# Patient Record
Sex: Female | Born: 1985 | Race: Black or African American | Hispanic: No | Marital: Single | Smoking: Current every day smoker
Health system: Southern US, Community
[De-identification: ages and names within clinical notes are randomized; demographics above are authoritative.]

## PROBLEM LIST (undated history)

## (undated) HISTORY — PX: CHOLECYSTECTOMY: SHX55

---

## 2007-04-24 ENCOUNTER — Emergency Department: Payer: Self-pay | Admitting: Emergency Medicine

## 2007-11-15 ENCOUNTER — Inpatient Hospital Stay: Payer: Self-pay

## 2010-07-01 ENCOUNTER — Inpatient Hospital Stay: Payer: Self-pay | Admitting: Surgery

## 2010-07-11 ENCOUNTER — Ambulatory Visit: Payer: Self-pay | Admitting: Surgery

## 2011-03-14 ENCOUNTER — Ambulatory Visit: Payer: Self-pay | Admitting: Internal Medicine

## 2012-07-26 ENCOUNTER — Emergency Department: Payer: Self-pay | Admitting: Emergency Medicine

## 2013-05-21 ENCOUNTER — Emergency Department: Payer: Self-pay | Admitting: Emergency Medicine

## 2013-07-28 ENCOUNTER — Emergency Department: Payer: Self-pay | Admitting: Emergency Medicine

## 2013-07-28 LAB — URINALYSIS, COMPLETE
Bacteria: NONE SEEN
Bilirubin,UR: NEGATIVE
Blood: NEGATIVE
Glucose,UR: NEGATIVE mg/dL (ref 0–75)
Ketone: NEGATIVE
LEUKOCYTE ESTERASE: NEGATIVE
NITRITE: NEGATIVE
PH: 7 (ref 4.5–8.0)
Protein: NEGATIVE
Specific Gravity: 1.018 (ref 1.003–1.030)
Squamous Epithelial: 4

## 2013-07-28 LAB — COMPREHENSIVE METABOLIC PANEL
Albumin: 3.8 g/dL (ref 3.4–5.0)
Alkaline Phosphatase: 76 U/L
Anion Gap: 4 — ABNORMAL LOW (ref 7–16)
BILIRUBIN TOTAL: 0.3 mg/dL (ref 0.2–1.0)
BUN: 9 mg/dL (ref 7–18)
CALCIUM: 8.7 mg/dL (ref 8.5–10.1)
CHLORIDE: 106 mmol/L (ref 98–107)
CREATININE: 0.76 mg/dL (ref 0.60–1.30)
Co2: 26 mmol/L (ref 21–32)
EGFR (African American): >60
GLUCOSE: 86 mg/dL (ref 65–99)
OSMOLALITY: 270 (ref 275–301)
POTASSIUM: 4 mmol/L (ref 3.5–5.1)
SGOT(AST): 19 U/L (ref 15–37)
SGPT (ALT): 18 U/L (ref 12–78)
Sodium: 136 mmol/L (ref 136–145)
TOTAL PROTEIN: 8 g/dL (ref 6.4–8.2)

## 2013-07-28 LAB — CBC WITH DIFFERENTIAL/PLATELET
Basophil #: 0 10*3/uL (ref 0.0–0.1)
Basophil %: 0.5 %
EOS PCT: 1.7 %
Eosinophil #: 0.2 10*3/uL (ref 0.0–0.7)
HCT: 41.4 % (ref 35.0–47.0)
HGB: 13.4 g/dL (ref 12.0–16.0)
LYMPHS ABS: 2.8 10*3/uL (ref 1.0–3.6)
LYMPHS PCT: 28.7 %
MCH: 28 pg (ref 26.0–34.0)
MCHC: 32.5 g/dL (ref 32.0–36.0)
MCV: 86 fL (ref 80–100)
Monocyte #: 0.7 x10 3/mm (ref 0.2–0.9)
Monocyte %: 7 %
NEUTROS ABS: 6 10*3/uL (ref 1.4–6.5)
NEUTROS PCT: 62.1 %
Platelet: 255 10*3/uL (ref 150–440)
RBC: 4.81 10*6/uL (ref 3.80–5.20)
RDW: 13.4 % (ref 11.5–14.5)
WBC: 9.6 10*3/uL (ref 3.6–11.0)

## 2013-07-28 LAB — WET PREP, GENITAL

## 2013-07-28 LAB — PREGNANCY, URINE: Pregnancy Test, Urine: NEGATIVE m[IU]/mL

## 2013-07-28 LAB — GC/CHLAMYDIA PROBE AMP

## 2014-04-07 ENCOUNTER — Emergency Department: Payer: Self-pay | Admitting: Emergency Medicine

## 2014-04-07 ENCOUNTER — Ambulatory Visit: Payer: Self-pay | Admitting: Emergency Medicine

## 2014-04-16 ENCOUNTER — Emergency Department: Payer: Self-pay | Admitting: Emergency Medicine

## 2014-07-25 ENCOUNTER — Emergency Department: Payer: Self-pay | Admitting: Emergency Medicine

## 2014-09-26 ENCOUNTER — Encounter: Payer: Self-pay | Admitting: *Deleted

## 2014-09-26 ENCOUNTER — Emergency Department
Admission: EM | Admit: 2014-09-26 | Discharge: 2014-09-26 | Disposition: A | Discharge status: Home / Self Care | Payer: Self-pay | Attending: Internal Medicine | Admitting: Internal Medicine

## 2014-09-26 DIAGNOSIS — J069 Acute upper respiratory infection, unspecified: Secondary | ICD-10-CM

## 2014-09-26 DIAGNOSIS — R110 Nausea: Secondary | ICD-10-CM | POA: Insufficient documentation

## 2014-09-26 MED ORDER — PROMETHAZINE-DM 6.25-15 MG/5ML PO SYRP: 5.0000 mL | ORAL_SOLUTION | Freq: Four times a day (QID) | ORAL | Status: AC | PRN

## 2014-09-26 MED ORDER — IBUPROFEN 800 MG PO TABS: 800.0000 mg | ORAL_TABLET | Freq: Three times a day (TID) | ORAL | Status: DC | PRN

## 2014-09-26 NOTE — ED Notes (Signed)
Pt here with c/o sore throat and body aches x 2 days.  Pt also c/o runny nose and occasional cough.  Denies any fevers.

## 2014-09-26 NOTE — ED Provider Notes (Signed)
[facility] Emergency Department Provider Note  ____________________________________________  Time seen: Approximately 4:20 PM  I have reviewed the triage vital signs and the nursing notes.   HISTORY  Chief Complaint Generalized Body Aches   HPI Patricia Washington is a 29 y.o. female complaining of URI signs or signs and symptoms insistent of facial pressure and pain sore throat and body aches. Patient also stated this occasional cough and is nausea associated with this. Patient denies any fever or chills at this time. She is rating the pain as 7/10  with the headache.  History reviewed. No pertinent past medical history.  There are no active problems to display for this patient.   Past Surgical History  Procedure Laterality Date  . Cesarean section    . Cholecystectomy      Current Outpatient Rx  Name  Route  Sig  Dispense  Refill  . promethazine-dextromethorphan (PROMETHAZINE-DM) 6.25-15 MG/5ML syrup   Oral   Take 5 mLs by mouth 4 (four) times daily as needed for cough.   118 mL   0     Allergies Review of patient's allergies indicates not on file.  No family history on file.  Social History History  Substance Use Topics  . Smoking status: Never Smoker   . Smokeless tobacco: Never Used  . Alcohol Use: No    Review of Systems Constitutional: No fever/chills Eyes: No visual changes. ENT: Sore throat Cardiovascular: Denies chest pain. Respiratory: Denies shortness of breath. Gastrointestinal: No abdominal pain.   Nausea, no vomiting.  No diarrhea.  No constipation. Genitourinary: Negative for dysuria. Musculoskeletal: Negative for back pain. Skin: Negative for rash. Neurological: Negative for headaches, focal weakness or numbness. Psychiatric:Negative Endocrine:Negative Hematological/Lymphatic:Negative Allergic/Immunilogical:  10-point ROS otherwise negative.  ____________________________________________   PHYSICAL  EXAM:  VITAL SIGNS: ED Triage Vitals  Enc Vitals Group     BP 09/26/14 1503 116/78 mmHg     Pulse Rate 09/26/14 1503 96     Resp --      Temp 09/26/14 1503 98.7 F (37.1 C)     Temp Source 09/26/14 1503 Oral     SpO2 09/26/14 1503 100 %     Weight 09/26/14 1503 170 lb (77.111 kg)     Height 09/26/14 1503 4\' 10"  (1.473 m)     Head Cir --      Peak Flow --      Pain Score 09/26/14 1504 7     Pain Loc --      Pain Edu? --      Excl. in GC? --     Constitutional: Alert and oriented. Well appearing and in no acute distress. Eyes: Conjunctivae are normal. PERRL. EOMI. Head: Atraumatic. Nose: No congestion/rhinnorhea. Mouth/Throat: Mucous membranes are moist.  Oropharynx erythematous. Neck: No stridor.   Hematological/Lymphatic/Immunilogical: No cervical lymphadenopathy. Cardiovascular: Normal rate, regular rhythm. Grossly normal heart sounds.  Good peripheral circulation. Respiratory: Normal respiratory effort.  No retractions. Lungs CTAB. Mild nonproductive cough. Gastrointestinal: Soft and nontender. No distention. No abdominal bruits. No CVA tenderness. Genitourinary: Not examined Musculoskeletal: No lower extremity tenderness nor edema.  No joint effusions. Neurologic:  Normal speech and language. No gross focal neurologic deficits are appreciated. Speech is normal. No gait instability. Skin:  Skin is warm, dry and intact. No rash noted. Psychiatric: Mood and affect are normal. Speech and behavior are normal.  ____________________________________________   LABS (all labs ordered are listed, but only abnormal results are displayed)  Labs Reviewed - No data to  display ____________________________________________  EKG   ____________________________________________  RADIOLOGY   ____________________________________________   PROCEDURES  Procedure(s) performed: None  Critical Care performed: No  ____________________________________________   INITIAL  IMPRESSION / ASSESSMENT AND PLAN / ED COURSE  Pertinent labs & imaging results that were available during my care of the patient were reviewed by me and considered in my medical decision making (see chart for details).  Upper respiratory infection ____________________________________________   FINAL CLINICAL IMPRESSION(S) / ED DIAGNOSES  Final diagnoses:  Upper respiratory infection      Joni ReiningRonald K Desta Bujak, PA-C 09/26/14 1632  Sherlyn HaySheryl L Gottlieb, DO 09/26/14 2248

## 2014-09-26 NOTE — Discharge Instructions (Signed)
Upper Respiratory Infection, Adult An upper respiratory infection (URI) is also sometimes known as the common cold. The upper respiratory tract includes the nose, sinuses, throat, trachea, and bronchi. Bronchi are the airways leading to the lungs. Most people improve within 1 week, but symptoms can last up to 2 weeks. A residual cough may last even longer.  CAUSES Many different viruses can infect the tissues lining the upper respiratory tract. The tissues become irritated and inflamed and often become very moist. Mucus production is also common. A cold is contagious. You can easily spread the virus to others by oral contact. This includes kissing, sharing a glass, coughing, or sneezing. Touching your mouth or nose and then touching a surface, which is then touched by another person, can also spread the virus. SYMPTOMS  Symptoms typically develop 1 to 3 days after you come in contact with a cold virus. Symptoms vary from person to person. They may include:  Runny nose.  Sneezing.  Nasal congestion.  Sinus irritation.  Sore throat.  Loss of voice (laryngitis).  Cough.  Fatigue.  Muscle aches.  Loss of appetite.  Headache.  Low-grade fever. DIAGNOSIS  You might diagnose your own cold based on familiar symptoms, since most people get a cold 2 to 3 times a year. Your caregiver can confirm this based on your exam. Most importantly, your caregiver can check that your symptoms are not due to another disease such as strep throat, sinusitis, pneumonia, asthma, or epiglottitis. Blood tests, throat tests, and X-rays are not necessary to diagnose a common cold, but they may sometimes be helpful in excluding other more serious diseases. Your caregiver will decide if any further tests are required. RISKS AND COMPLICATIONS  You may be at risk for a more severe case of the common cold if you smoke cigarettes, have chronic heart disease (such as heart failure) or lung disease (such as asthma), or if  you have a weakened immune system. The very young and very old are also at risk for more serious infections. Bacterial sinusitis, middle ear infections, and bacterial pneumonia can complicate the common cold. The common cold can worsen asthma and chronic obstructive pulmonary disease (COPD). Sometimes, these complications can require emergency medical care and may be life-threatening. PREVENTION  The best way to protect against getting a cold is to practice good hygiene. Avoid oral or hand contact with people with cold symptoms. Wash your hands often if contact occurs. There is no clear evidence that vitamin C, vitamin E, echinacea, or exercise reduces the chance of developing a cold. However, it is always recommended to get plenty of rest and practice good nutrition. TREATMENT  Treatment is directed at relieving symptoms. There is no cure. Antibiotics are not effective, because the infection is caused by a virus, not by bacteria. Treatment may include:  Increased fluid intake. Sports drinks offer valuable electrolytes, sugars, and fluids.  Breathing heated mist or steam (vaporizer or shower).  Eating chicken soup or other clear broths, and maintaining good nutrition.  Getting plenty of rest.  Using gargles or lozenges for comfort.  Controlling fevers with ibuprofen or acetaminophen as directed by your caregiver.  Increasing usage of your inhaler if you have asthma. Zinc gel and zinc lozenges, taken in the first 24 hours of the common cold, can shorten the duration and lessen the severity of symptoms. Pain medicines may help with fever, muscle aches, and throat pain. A variety of non-prescription medicines are available to treat congestion and runny nose. Your caregiver   can make recommendations and may suggest nasal or lung inhalers for other symptoms.  HOME CARE INSTRUCTIONS   Only take over-the-counter or prescription medicines for pain, discomfort, or fever as directed by your  caregiver.  Use a warm mist humidifier or inhale steam from a shower to increase air moisture. This may keep secretions moist and make it easier to breathe.  Drink enough water and fluids to keep your urine clear or pale yellow.  Rest as needed.  Return to work when your temperature has returned to normal or as your caregiver advises. You may need to stay home longer to avoid infecting others. You can also use a face mask and careful hand washing to prevent spread of the virus. SEEK MEDICAL CARE IF:   After the first few days, you feel you are getting worse rather than better.  You need your caregiver's advice about medicines to control symptoms.  You develop chills, worsening shortness of breath, or brown or red sputum. These may be signs of pneumonia.  You develop yellow or brown nasal discharge or pain in the face, especially when you bend forward. These may be signs of sinusitis.  You develop a fever, swollen neck glands, pain with swallowing, or white areas in the back of your throat. These may be signs of strep throat. SEEK IMMEDIATE MEDICAL CARE IF:   You have a fever.  You develop severe or persistent headache, ear pain, sinus pain, or chest pain.  You develop wheezing, a prolonged cough, cough up blood, or have a change in your usual mucus (if you have chronic lung disease).  You develop sore muscles or a stiff neck. Document Released: 10/29/2000 Document Revised: 07/28/2011 Document Reviewed: 08/10/2013 Exit[employer] Patient Information 2015 [employer]. This information is not intended to replace advice given to you by your health care provider. Make sure you discuss any questions you have with your health care provider.  

## 2015-06-11 ENCOUNTER — Encounter: Payer: Self-pay | Admitting: Emergency Medicine

## 2015-06-11 ENCOUNTER — Emergency Department
Admission: EM | Admit: 2015-06-11 | Discharge: 2015-06-11 | Disposition: A | Discharge status: Home / Self Care | Payer: Self-pay | Attending: Emergency Medicine | Admitting: Emergency Medicine

## 2015-06-11 DIAGNOSIS — N3090 Cystitis, unspecified without hematuria: Secondary | ICD-10-CM | POA: Insufficient documentation

## 2015-06-11 DIAGNOSIS — N390 Urinary tract infection, site not specified: Secondary | ICD-10-CM

## 2015-06-11 DIAGNOSIS — Z3202 Encounter for pregnancy test, result negative: Secondary | ICD-10-CM | POA: Insufficient documentation

## 2015-06-11 LAB — BASIC METABOLIC PANEL
ANION GAP: 5 (ref 5–15)
BUN: 13 mg/dL (ref 6–20)
CO2: 24 mmol/L (ref 22–32)
Calcium: 8.8 mg/dL — ABNORMAL LOW (ref 8.9–10.3)
Chloride: 110 mmol/L (ref 101–111)
Creatinine, Ser: 0.52 mg/dL (ref 0.44–1.00)
GFR calc Af Amer: >60 mL/min (ref >60)
GLUCOSE: 95 mg/dL (ref 65–99)
POTASSIUM: 3.9 mmol/L (ref 3.5–5.1)
Sodium: 139 mmol/L (ref 135–145)

## 2015-06-11 LAB — CBC WITH DIFFERENTIAL/PLATELET
BASOS ABS: 0 10*3/uL (ref 0–0.1)
Basophils Relative: 1 %
Eosinophils Absolute: 0.2 10*3/uL (ref 0–0.7)
Eosinophils Relative: 2 %
HEMATOCRIT: 37.1 % (ref 35.0–47.0)
Hemoglobin: 12.2 g/dL (ref 12.0–16.0)
LYMPHS PCT: 28 %
Lymphs Abs: 2.4 10*3/uL (ref 1.0–3.6)
MCH: 27.2 pg (ref 26.0–34.0)
MCHC: 32.9 g/dL (ref 32.0–36.0)
MCV: 82.5 fL (ref 80.0–100.0)
Monocytes Absolute: 0.5 10*3/uL (ref 0.2–0.9)
Monocytes Relative: 7 %
NEUTROS ABS: 5.2 10*3/uL (ref 1.4–6.5)
NEUTROS PCT: 62 %
PLATELETS: 254 10*3/uL (ref 150–440)
RBC: 4.5 MIL/uL (ref 3.80–5.20)
RDW: 13.7 % (ref 11.5–14.5)
WBC: 8.4 10*3/uL (ref 3.6–11.0)

## 2015-06-11 LAB — URINALYSIS COMPLETE WITH MICROSCOPIC (ARMC ONLY)
Bilirubin Urine: NEGATIVE
GLUCOSE, UA: NEGATIVE mg/dL
KETONES UR: NEGATIVE mg/dL
NITRITE: POSITIVE — AB
Protein, ur: NEGATIVE mg/dL
SPECIFIC GRAVITY, URINE: 1.02 (ref 1.005–1.030)
pH: 5 (ref 5.0–8.0)

## 2015-06-11 LAB — CHLAMYDIA/NGC RT PCR (ARMC ONLY)
Chlamydia Tr: NOT DETECTED
N gonorrhoeae: NOT DETECTED

## 2015-06-11 LAB — POCT PREGNANCY, URINE: Preg Test, Ur: NEGATIVE

## 2015-06-11 MED ORDER — PHENAZOPYRIDINE HCL 200 MG PO TABS: 200.0000 mg | ORAL_TABLET | Freq: Three times a day (TID) | ORAL | Status: AC | PRN

## 2015-06-11 MED ORDER — HYDROMORPHONE HCL 1 MG/ML IJ SOLN
1.0000 mg | Freq: Once | INTRAMUSCULAR | Status: AC
Start: 2015-06-11 — End: 2015-06-11
  Administered 2015-06-11: 1 mg via INTRAMUSCULAR
  Filled 2015-06-11: qty 1

## 2015-06-11 MED ORDER — OXYCODONE-ACETAMINOPHEN 5-325 MG PO TABS
2.0000 | ORAL_TABLET | Freq: Once | ORAL | Status: AC
  Administered 2015-06-11: 2 via ORAL
  Filled 2015-06-11: qty 2

## 2015-06-11 MED ORDER — SULFAMETHOXAZOLE-TRIMETHOPRIM 800-160 MG PO TABS: 1.0000 | ORAL_TABLET | Freq: Two times a day (BID) | ORAL | Status: AC

## 2015-06-11 MED ORDER — SULFAMETHOXAZOLE-TRIMETHOPRIM 800-160 MG PO TABS
1.0000 | ORAL_TABLET | Freq: Once | ORAL | Status: AC
  Administered 2015-06-11: 1 via ORAL
  Filled 2015-06-11: qty 1

## 2015-06-11 MED ORDER — IBUPROFEN 800 MG PO TABS: 800.0000 mg | ORAL_TABLET | Freq: Three times a day (TID) | ORAL | Status: DC | PRN

## 2015-06-11 MED ORDER — PHENAZOPYRIDINE HCL 200 MG PO TABS
200.0000 mg | ORAL_TABLET | Freq: Once | ORAL | Status: AC
  Administered 2015-06-11: 200 mg via ORAL
  Filled 2015-06-11: qty 1

## 2015-06-11 NOTE — ED Provider Notes (Signed)
[facility] Emergency Department Provider Note     Time seen: ----------------------------------------- 8:19 AM on 06/11/2015 -----------------------------------------    I have reviewed the triage vital signs and the nursing notes.   HISTORY  Chief Complaint Abdominal Pain and Flank Pain    HPI Patricia Washington is a 30 y.o. female who presents ER for lower abdominal pain and right flank painfor the past several days. Patient states she has exquisite pain with urination. Patient denies any vaginal discharge but has had some spotting intermittently. Patient states her menstrual cycles are irregular because she has an IUD. She denies fever or chills or other complaints this time. She has not had a history of UTIs.   History reviewed. No pertinent past medical history.  There are no active problems to display for this patient.   Past Surgical History  Procedure Laterality Date  . Cesarean section    . Cholecystectomy      Allergies Review of patient's allergies indicates no known allergies.  Social History Social History  Substance Use Topics  . Smoking status: Never Smoker   . Smokeless tobacco: Never Used  . Alcohol Use: No    Review of Systems Constitutional: Negative for fever. Eyes: Negative for visual changes. ENT: Negative for sore throat. Cardiovascular: Negative for chest pain. Respiratory: Negative for shortness of breath. Gastrointestinal: Positive for flank pain  Genitourinary: Positive for dysuria Musculoskeletal: Negative for back pain. Skin: Negative for rash. Neurological: Negative for headaches, focal weakness or numbness.  10-point ROS otherwise negative.  ____________________________________________   PHYSICAL EXAM:  VITAL SIGNS: ED Triage Vitals  Enc Vitals Group     BP 06/11/15 0802 138/63 mmHg     Pulse Rate 06/11/15 0802 82     Resp 06/11/15 0802 18     Temp 06/11/15 0802 98.1 F (36.7 C)     Temp  Source 06/11/15 0802 Oral     SpO2 06/11/15 0802 98 %     Weight 06/11/15 0802 175 lb (79.379 kg)     Height 06/11/15 0802  (1.473 m)     Head Cir --      Peak Flow --      Pain Score 06/11/15 0817 9     Pain Loc --      Pain Edu? --      Excl. in GC? --     Constitutional: Alert and oriented. Well appearing and in no distress. Eyes: Conjunctivae are normal. PERRL. Normal extraocular movements. ENT   Head: Normocephalic and atraumatic.   Nose: No congestion/rhinnorhea.   Mouth/Throat: Mucous membranes are moist.   Neck: No stridor. Cardiovascular: Normal rate, regular rhythm. Normal and symmetric distal pulses are present in all extremities. No murmurs, rubs, or gallops. Respiratory: Normal respiratory effort without tachypnea nor retractions. Breath sounds are clear and equal bilaterally. No wheezes/rales/rhonchi. Gastrointestinal: Soft and nontender. No distention. No abdominal bruits.  Musculoskeletal: Nontender with normal range of motion in all extremities. No joint effusions.  No lower extremity tenderness nor edema. Neurologic:  Normal speech and language. No gross focal neurologic deficits are appreciated. Speech is normal. No gait instability. Skin:  Skin is warm, dry and intact. No rash noted. Psychiatric: Mood and affect are normal. Speech and behavior are normal. Patient exhibits appropriate insight and judgment. ____________________________________________  ED COURSE:  Pertinent labs & imaging results that were available during my care of the patient were reviewed by me and considered in my medical decision making (see chart for details). Patient  is in no acute distress, likely UTI. We'll check basic labs and urinalysis. ____________________________________________    LABS (pertinent positives/negatives)  Labs Reviewed  BASIC METABOLIC PANEL - Abnormal; Notable for the following:    Calcium 8.8 (*)    All other components within normal limits   URINALYSIS COMPLETEWITH MICROSCOPIC ([facility] ONLY) - Abnormal; Notable for the following:    Color, Urine YELLOW (*)    APPearance CLEAR (*)    Hgb urine dipstick 1+ (*)    Nitrite POSITIVE (*)    Leukocytes, UA 2+ (*)    Bacteria, UA MANY (*)    Squamous Epithelial / LPF 0-5 (*)    All other components within normal limits  CHLAMYDIA/NGC RT PCR ([facility] ONLY)  CBC WITH DIFFERENTIAL/PLATELET  POC URINE PREG, ED  POCT PREGNANCY, URINE    ____________________________________________  FINAL ASSESSMENT AND PLAN  Cystitis  Plan: Patient with labs and imaging as dictated above. Patient clinically with UTI, no systemic symptoms consistent with pyelonephritis. She was given Pyridium and Bactrim DS. She'll be discharged on same. Is advised to follow-up in 48 hours not improving.   Emily Filbert, MD   Emily Filbert, MD 06/11/15 1006

## 2015-06-11 NOTE — ED Notes (Signed)
Pt here with c/o lower abd pain and left flank pain for the past few days, burning and pain with urination, worsening today.

## 2015-06-11 NOTE — Discharge Instructions (Signed)

## 2015-11-04 ENCOUNTER — Encounter: Payer: Self-pay | Admitting: Emergency Medicine

## 2015-11-04 ENCOUNTER — Emergency Department
Admission: EM | Admit: 2015-11-04 | Discharge: 2015-11-04 | Disposition: A | Discharge status: Home / Self Care | Payer: Self-pay | Attending: Emergency Medicine | Admitting: Emergency Medicine

## 2015-11-04 ENCOUNTER — Emergency Department: Payer: Self-pay

## 2015-11-04 DIAGNOSIS — R1031 Right lower quadrant pain: Secondary | ICD-10-CM | POA: Insufficient documentation

## 2015-11-04 DIAGNOSIS — R1030 Lower abdominal pain, unspecified: Secondary | ICD-10-CM

## 2015-11-04 DIAGNOSIS — Z79899 Other long term (current) drug therapy: Secondary | ICD-10-CM | POA: Insufficient documentation

## 2015-11-04 DIAGNOSIS — Z792 Long term (current) use of antibiotics: Secondary | ICD-10-CM | POA: Insufficient documentation

## 2015-11-04 LAB — COMPREHENSIVE METABOLIC PANEL
ALK PHOS: 71 U/L (ref 38–126)
ALT: 51 U/L (ref 14–54)
AST: 35 U/L (ref 15–41)
Albumin: 4.6 g/dL (ref 3.5–5.0)
Anion gap: 10 (ref 5–15)
BILIRUBIN TOTAL: 0.3 mg/dL (ref 0.3–1.2)
BUN: 12 mg/dL (ref 6–20)
CO2: 24 mmol/L (ref 22–32)
CREATININE: 0.65 mg/dL (ref 0.44–1.00)
Calcium: 9.2 mg/dL (ref 8.9–10.3)
Chloride: 105 mmol/L (ref 101–111)
GFR calc Af Amer: >60 mL/min (ref >60)
GLUCOSE: 112 mg/dL — AB (ref 65–99)
Potassium: 4 mmol/L (ref 3.5–5.1)
Sodium: 139 mmol/L (ref 135–145)
TOTAL PROTEIN: 8.4 g/dL — AB (ref 6.5–8.1)

## 2015-11-04 LAB — URINALYSIS COMPLETE WITH MICROSCOPIC (ARMC ONLY)
BILIRUBIN URINE: NEGATIVE
GLUCOSE, UA: NEGATIVE mg/dL
HGB URINE DIPSTICK: NEGATIVE
Ketones, ur: NEGATIVE mg/dL
LEUKOCYTES UA: NEGATIVE
NITRITE: NEGATIVE
Protein, ur: >500 mg/dL — AB
Specific Gravity, Urine: 1.029 (ref 1.005–1.030)
pH: 7 (ref 5.0–8.0)

## 2015-11-04 LAB — CBC
HCT: 39.2 % (ref 35.0–47.0)
Hemoglobin: 13.2 g/dL (ref 12.0–16.0)
MCH: 28.1 pg (ref 26.0–34.0)
MCHC: 33.6 g/dL (ref 32.0–36.0)
MCV: 83.7 fL (ref 80.0–100.0)
PLATELETS: 295 10*3/uL (ref 150–440)
RBC: 4.69 MIL/uL (ref 3.80–5.20)
RDW: 14.4 % (ref 11.5–14.5)
WBC: 14.4 10*3/uL — AB (ref 3.6–11.0)

## 2015-11-04 LAB — POCT PREGNANCY, URINE: PREG TEST UR: NEGATIVE

## 2015-11-04 LAB — LIPASE, BLOOD: Lipase: 15 U/L (ref 11–51)

## 2015-11-04 MED ORDER — ONDANSETRON 4 MG PO TBDP
4.0000 mg | ORAL_TABLET | Freq: Once | ORAL | Status: AC
  Administered 2015-11-04: 4 mg via ORAL
  Filled 2015-11-04: qty 1

## 2015-11-04 MED ORDER — DICYCLOMINE HCL 20 MG PO TABS: 20.0000 mg | ORAL_TABLET | Freq: Three times a day (TID) | ORAL | Status: AC | PRN

## 2015-11-04 MED ORDER — ONDANSETRON HCL 4 MG PO TABS: 4.0000 mg | ORAL_TABLET | Freq: Three times a day (TID) | ORAL | Status: AC | PRN

## 2015-11-04 MED ORDER — SODIUM CHLORIDE 0.9 % IV BOLUS (SEPSIS)
500.0000 mL | Freq: Once | INTRAVENOUS | Status: AC
  Administered 2015-11-04: 500 mL via INTRAVENOUS

## 2015-11-04 MED ORDER — ONDANSETRON HCL 4 MG/2ML IJ SOLN
4.0000 mg | Freq: Once | INTRAMUSCULAR | Status: AC
  Administered 2015-11-04: 4 mg via INTRAVENOUS
  Filled 2015-11-04: qty 2

## 2015-11-04 MED ORDER — HYDROMORPHONE HCL 1 MG/ML IJ SOLN
0.5000 mg | Freq: Once | INTRAMUSCULAR | Status: AC
  Administered 2015-11-04: 0.5 mg via INTRAVENOUS
  Filled 2015-11-04: qty 1

## 2015-11-04 NOTE — ED Notes (Signed)
Pt taken to CT at this time, denies any pain, NAD noted, respirations even and unlabored at this time.

## 2015-11-04 NOTE — ED Notes (Signed)
Pt reports nausea and vomiting since 0200 today; Pt reports upon vomiting, she has "clear, gel" loose stools. Pt reports a few alcoholic drinks last night, denies any excessive use.

## 2015-11-04 NOTE — ED Provider Notes (Signed)
Time Seen: Approximately 1410 I have reviewed the triage notes  Chief Complaint: Emesis   History of Present Illness: Patricia Washington is a 30 y.o. female who presents with some lower abdominal crampy discomfort with nausea, vomiting and clear stool. Somewhat loose in appearance with no melena or hematochezia. Pain is lower middle to right lower quadrant without any epigastric or right upper quadrant discomfort. She states it seems to be crampy and persistent. She's had a previous cholecystectomy but otherwise no surgical management. She feels it is unlikely that she's pregnant currently has ED device. She denies any vaginal discharge or bleeding, dysuria or hematuria. She did drink alcohol last night but not in a significant quantity and is has not had exposure to any abnormal foods. She denies any recent antibiotics or travel. History reviewed. No pertinent past medical history.  There are no active problems to display for this patient.   Past Surgical History  Procedure Laterality Date  . Cesarean section    . Cholecystectomy      Past Surgical History  Procedure Laterality Date  . Cesarean section    . Cholecystectomy      Current Outpatient Rx  Name  Route  Sig  Dispense  Refill  . dicyclomine (BENTYL) 20 MG tablet   Oral   Take 1 tablet (20 mg total) by mouth 3 (three) times daily as needed for spasms.   30 tablet   0   . ibuprofen (ADVIL,MOTRIN) 800 MG tablet   Oral   Take 1 tablet (800 mg total) by mouth every 8 (eight) hours as needed.   30 tablet   0   . ondansetron (ZOFRAN) 4 MG tablet   Oral   Take 1 tablet (4 mg total) by mouth every 8 (eight) hours as needed for nausea or vomiting.   21 tablet   0   . phenazopyridine (PYRIDIUM) 200 MG tablet   Oral   Take 1 tablet (200 mg total) by mouth 3 (three) times daily as needed for pain.   20 tablet   0   . sulfamethoxazole-trimethoprim (BACTRIM DS) 800-160 MG tablet   Oral   Take 1 tablet by mouth 2 (two)  times daily.   20 tablet   0     Allergies:  Review of patient's allergies indicates no known allergies.  Family History: No family history on file.  Social History: Social History  Substance Use Topics  . Smoking status: Never Smoker   . Smokeless tobacco: Never Used  . Alcohol Use: No     Review of Systems:   10 point review of systems was performed and was otherwise negative:  Constitutional: No fever Eyes: No visual disturbances ENT: No sore throat, ear pain Cardiac: No chest pain Respiratory: No shortness of breath, wheezing, or stridor Abdomen: Abdominal discomfort toward her right lower quadrantstool no persistent diarrhea Endocrine: No weight loss, No night sweats Extremities: No peripheral edema, cyanosis Skin: No rashes, easy bruising Neurologic: No focal weakness, trouble with speech or swollowing Urologic: No dysuria, Hematuria, or urinary frequency   Physical Exam:  ED Triage Vitals  Enc Vitals Group     BP 11/04/15 1051 125/76 mmHg     Pulse Rate 11/04/15 1051 110     Resp 11/04/15 1051 16     Temp 11/04/15 1051 98.4 F (36.9 C)     Temp Source 11/04/15 1051 Oral     SpO2 11/04/15 1051 98 %     Weight 11/04/15 1051 180  lb (81.647 kg)     Height 11/04/15 1051  (1.473 m)     Head Cir --      Peak Flow --      Pain Score 11/04/15 1052 7     Pain Loc --      Pain Edu? --      Excl. in GC? --     General: Awake , Alert , and Oriented times 3; GCS 15 Head: Normal cephalic , atraumatic Eyes: Pupils equal , round, reactive to light Nose/Throat: No nasal drainage, patent upper airway without erythema or exudate.  Neck: Supple, Full range of motion, No anterior adenopathy or palpable thyroid masses Lungs: Clear to ascultation without wheezes , rhonchi, or rales Heart: Regular rate, regular rhythm without murmurs , gallops , or rubs Abdomen: Mild tenderness toward the right lower quadrant without rebound, guarding, or rigidity. Bowel sounds  are positive and symmetric in all 4 quadrants. No obvious focal tenderness over McBurney's point. No reproducible back or flank pain      Extremities: 2 plus symmetric pulses. No edema, clubbing or cyanosis Neurologic: normal ambulation, Motor symmetric without deficits, sensory intact Skin: warm, dry, no rashes   Labs:   All laboratory work was reviewed including any pertinent negatives or positives listed below:  Labs Reviewed  COMPREHENSIVE METABOLIC PANEL - Abnormal; Notable for the following:    Glucose, Bld 112 (*)    Total Protein 8.4 (*)    All other components within normal limits  CBC - Abnormal; Notable for the following:    WBC 14.4 (*)    All other components within normal limits  URINALYSIS COMPLETEWITH MICROSCOPIC ([facility] ONLY) - Abnormal; Notable for the following:    Color, Urine YELLOW (*)    APPearance CLEAR (*)    Protein, ur >500 (*)    Bacteria, UA RARE (*)    Squamous Epithelial / LPF 0-5 (*)    All other components within normal limits  LIPASE, BLOOD  POC URINE PREG, ED  POCT PREGNANCY, URINE     Radiology:   CT RENAL STONE STUDY (Final result) Result time: 11/04/15 12:35:37   Final result by Rad Results In Interface (11/04/15 12:35:37)   Narrative:   CLINICAL DATA: Pt states N/V/D and lower abd/pelvic pain started at 2am today. Pt with hx of cholecystectomy and c-section. Low Dose 1 study used per protocol.  EXAM: CT ABDOMEN AND PELVIS WITHOUT CONTRAST  TECHNIQUE: Multidetector CT imaging of the abdomen and pelvis was performed following the standard protocol without IV contrast.  COMPARISON: None.  FINDINGS: Lung bases: Clear. Heart normal in size.  Hepatobiliary: Mild fatty infiltration of the liver. No mass or focal lesion. Gallbladder surgically absent. No bile duct dilation.  Spleen, pancreas, adrenal glands: Unremarkable.  Kidneys, ureters, bladder: Normal.  Uterus and adnexa: Well-positioned IUD. Otherwise  unremarkable.  Lymph nodes: No adenopathy.  Ascites: None.  Gastrointestinal: Unremarkable. Normal appendix visualized.  Musculoskeletal: Unremarkable.  IMPRESSION: 1. No acute findings. No findings to account for this patient's symptoms. 2. Mild hepatic steatosis. Status post cholecystectomy. No other abnormalities.   Electronically Signed By: Amie Portland M.D.       I personally reviewed the radiologic studies    ED Course: * Patient's stay here showed symptomatic improvement with some IV Dilaudid and Zofran. She has a slightly elevated white blood cell count but otherwise no surgical findings seen on CAT scan. Her repeat exam was negative. I felt this was unlikely to be a surgical  abdomen such as acute appendicitis or bowel obstruction. Gastroenteritis based on descriptions of nausea vomiting and some loose stool. The patient was prescribed outpatient medications and advised to return here if she has any hematemesis or biliary emesis or any other new concerns    Assessment:  Acute unspecified abdominal pain with nausea vomiting and loose stool   Plan:  Outpatient Prescription for Bentyl and Zofran Patient was advised to return immediately if condition worsens. Patient was advised to follow up with their primary care physician or other specialized physicians involved in their outpatient care. The patient and/or family member/power of attorney had laboratory results reviewed at the bedside. All questions and concerns were addressed and appropriate discharge instructions were distributed by the nursing staff.             Jennye MoccasinBrian S Quigley, MD 11/04/15 [phone]

## 2015-11-04 NOTE — ED Notes (Signed)
Pt returned from CT at this time. NAD noted at this time. Denies any needs at this time. Will continue to monitor at this time.

## 2015-11-04 NOTE — ED Notes (Signed)
Discussed discharge instructions, prescriptions, and follow-up care with patient. No questions or concerns at this time. Pt stable at discharge.  

## 2015-11-04 NOTE — Discharge Instructions (Signed)
Abdominal Pain, Adult Many things can cause abdominal pain. Usually, abdominal pain is not caused by a disease and will improve without treatment. It can often be observed and treated at home. Your health care provider will do a physical exam and possibly order blood tests and X-rays to help determine the seriousness of your pain. However, in many cases, more time must pass before a clear cause of the pain can be found. Before that point, your health care provider may not know if you need more testing or further treatment. HOME CARE INSTRUCTIONS Monitor your abdominal pain for any changes. The following actions may help to alleviate any discomfort you are experiencing:  Only take over-the-counter or prescription medicines as directed by your health care provider.  Do not take laxatives unless directed to do so by your health care provider.  Try a clear liquid diet (broth, tea, or water) as directed by your health care provider. Slowly move to a bland diet as tolerated. SEEK MEDICAL CARE IF:  You have unexplained abdominal pain.  You have abdominal pain associated with nausea or diarrhea.  You have pain when you urinate or have a bowel movement.  You experience abdominal pain that wakes you in the night.  You have abdominal pain that is worsened or improved by eating food.  You have abdominal pain that is worsened with eating fatty foods.  You have a fever. SEEK IMMEDIATE MEDICAL CARE IF:  Your pain does not go away within 2 hours.  You keep throwing up (vomiting).  Your pain is felt only in portions of the abdomen, such as the right side or the left lower portion of the abdomen.  You pass bloody or black tarry stools. MAKE SURE YOU:  Understand these instructions.  Will watch your condition.  Will get help right away if you are not doing well or get worse.   This information is not intended to replace advice given to you by your health care provider. Make sure you discuss  any questions you have with your health care provider.   Document Released: 02/12/2005 Document Revised: 01/24/2015 Document Reviewed: 01/12/2013 Elsevier Interactive Patient Education [employer]2016 [employer]vier Inc.  Please return immediately if condition worsens. Please contact her primary physician or the physician you were given for referral. If you have any specialist physicians involved in her treatment and plan please also contact them. Thank you for using [facility]. Advance diet as tolerated. Drink plenty of fluids. You can also take Tylenol and/or ibuprofen for pain.

## 2015-11-04 NOTE — ED Notes (Signed)
Patient transported to CT 

## 2016-04-06 ENCOUNTER — Emergency Department
Admission: EM | Admit: 2016-04-06 | Discharge: 2016-04-06 | Disposition: A | Discharge status: Home / Self Care | Payer: Self-pay | Attending: Emergency Medicine | Admitting: Emergency Medicine

## 2016-04-06 ENCOUNTER — Emergency Department: Payer: Self-pay

## 2016-04-06 ENCOUNTER — Encounter: Payer: Self-pay | Admitting: Emergency Medicine

## 2016-04-06 DIAGNOSIS — R0789 Other chest pain: Secondary | ICD-10-CM | POA: Insufficient documentation

## 2016-04-06 DIAGNOSIS — R1012 Left upper quadrant pain: Secondary | ICD-10-CM | POA: Insufficient documentation

## 2016-04-06 DIAGNOSIS — R1010 Upper abdominal pain, unspecified: Secondary | ICD-10-CM

## 2016-04-06 DIAGNOSIS — R1032 Left lower quadrant pain: Secondary | ICD-10-CM | POA: Insufficient documentation

## 2016-04-06 DIAGNOSIS — Z791 Long term (current) use of non-steroidal anti-inflammatories (NSAID): Secondary | ICD-10-CM | POA: Insufficient documentation

## 2016-04-06 LAB — URINALYSIS COMPLETE WITH MICROSCOPIC (ARMC ONLY)
Bacteria, UA: NONE SEEN
Bilirubin Urine: NEGATIVE
GLUCOSE, UA: NEGATIVE mg/dL
Hgb urine dipstick: NEGATIVE
KETONES UR: NEGATIVE mg/dL
NITRITE: NEGATIVE
PROTEIN: 30 mg/dL — AB
SPECIFIC GRAVITY, URINE: 1.023 (ref 1.005–1.030)
pH: 6 (ref 5.0–8.0)

## 2016-04-06 LAB — COMPREHENSIVE METABOLIC PANEL
ALBUMIN: 4.2 g/dL (ref 3.5–5.0)
ALK PHOS: 64 U/L (ref 38–126)
ALT: 15 U/L (ref 14–54)
ANION GAP: 5 (ref 5–15)
AST: 16 U/L (ref 15–41)
BILIRUBIN TOTAL: 0.6 mg/dL (ref 0.3–1.2)
BUN: 17 mg/dL (ref 6–20)
CALCIUM: 9.1 mg/dL (ref 8.9–10.3)
CO2: 24 mmol/L (ref 22–32)
CREATININE: 0.78 mg/dL (ref 0.44–1.00)
Chloride: 107 mmol/L (ref 101–111)
GFR calc Af Amer: >60 mL/min (ref >60)
GFR calc non Af Amer: >60 mL/min (ref >60)
GLUCOSE: 103 mg/dL — AB (ref 65–99)
Potassium: 4.2 mmol/L (ref 3.5–5.1)
SODIUM: 136 mmol/L (ref 135–145)
TOTAL PROTEIN: 7.6 g/dL (ref 6.5–8.1)

## 2016-04-06 LAB — CBC
HCT: 38.5 % (ref 35.0–47.0)
Hemoglobin: 13.1 g/dL (ref 12.0–16.0)
MCH: 28.5 pg (ref 26.0–34.0)
MCHC: 34 g/dL (ref 32.0–36.0)
MCV: 83.7 fL (ref 80.0–100.0)
PLATELETS: 262 10*3/uL (ref 150–440)
RBC: 4.6 MIL/uL (ref 3.80–5.20)
RDW: 14.4 % (ref 11.5–14.5)
WBC: 9.2 10*3/uL (ref 3.6–11.0)

## 2016-04-06 LAB — POCT PREGNANCY, URINE: Preg Test, Ur: NEGATIVE

## 2016-04-06 LAB — TROPONIN I: Troponin I: <0.03 ng/mL (ref <0.03)

## 2016-04-06 MED ORDER — KETOROLAC TROMETHAMINE 30 MG/ML IJ SOLN: 30.0000 mg | Freq: Once | INTRAMUSCULAR | Status: DC

## 2016-04-06 MED ORDER — CARISOPRODOL 350 MG PO TABS: 350.0000 mg | ORAL_TABLET | Freq: Three times a day (TID) | ORAL | 0 refills | Status: AC | PRN

## 2016-04-06 MED ORDER — MORPHINE SULFATE (PF) 4 MG/ML IV SOLN
4.0000 mg | Freq: Once | INTRAVENOUS | Status: AC
  Administered 2016-04-06: 4 mg via INTRAVENOUS
  Filled 2016-04-06: qty 1

## 2016-04-06 MED ORDER — ONDANSETRON HCL 4 MG/2ML IJ SOLN
4.0000 mg | Freq: Once | INTRAMUSCULAR | Status: AC
  Administered 2016-04-06: 4 mg via INTRAVENOUS
  Filled 2016-04-06: qty 2

## 2016-04-06 MED ORDER — GI COCKTAIL ~~LOC~~
30.0000 mL | Freq: Once | ORAL | Status: AC
  Administered 2016-04-06: 30 mL via ORAL
  Filled 2016-04-06: qty 30

## 2016-04-06 MED ORDER — FAMOTIDINE 40 MG PO TABS: 40.0000 mg | ORAL_TABLET | Freq: Every evening | ORAL | 0 refills | Status: AC

## 2016-04-06 MED ORDER — KETOROLAC TROMETHAMINE 30 MG/ML IJ SOLN
30.0000 mg | Freq: Once | INTRAMUSCULAR | Status: AC
  Administered 2016-04-06: 30 mg via INTRAMUSCULAR
  Filled 2016-04-06: qty 1

## 2016-04-06 NOTE — ED Triage Notes (Signed)
Pt states left lateral chest wall pain that began 3 days pta. Pt states this am she began to have bilateral upper quadrant pain. Pt denies shob, nausea, vomiting. Pt states pain is worse with movement and deep breath. resps unlabored.

## 2016-04-06 NOTE — ED Provider Notes (Addendum)
[facility] Emergency Department Provider Note  ____________________________________________   First MD Initiated Contact with Patient 04/06/16 [phone]     (approximate)  I have reviewed the triage vital signs and the nursing notes.   HISTORY  Chief Complaint Abdominal Pain   HPI Patricia Washington is a 30 y.o. female with a history of C-section as well as cholecystectomy who is presenting with upper abdominal pain. She says that the upper abdominal pain started this morning and is across her upper abdomen. She denies any nausea or vomiting. She says that she isn't having diarrhea but this is an ongoing issue for years. She denies any blood in her stool. Says that the pain is an 8 out of 10 and is worse with breathing. She is also reporting left-sided chest pain which is just anterior to the left axilla. She says that the chest pain has been ongoing for 3 days and is worse with movement. She says that she works at [employer] and does a lot of lifting and thinks this may be contributing. She says that she took tramadol as well as Aleve which helped temporarily. She is unable to characterize the pain is sharp and dull or cramping. Says it is just "pain."Does not report any radiation of the chest pain. Says that she does not take any hormone supplementation such as birth control does have a Mirena.   History reviewed. No pertinent past medical history.  There are no active problems to display for this patient.   Past Surgical History:  Procedure Laterality Date  . CESAREAN SECTION    . CHOLECYSTECTOMY      Prior to Admission medications   Medication Sig Start Date End Date Taking? Authorizing Provider  dicyclomine (BENTYL) 20 MG tablet Take 1 tablet (20 mg total) by mouth 3 (three) times daily as needed for spasms. 11/04/15  Yes Jennye MoccasinBrian S Quigley, MD  naproxen sodium (ANAPROX) 220 MG tablet Take 440 mg by mouth 2 (two) times daily with a meal.   Yes Historical  Provider, MD  ibuprofen (ADVIL,MOTRIN) 800 MG tablet Take 1 tablet (800 mg total) by mouth every 8 (eight) hours as needed. Patient not taking: Reported on 04/06/2016 06/11/15   Emily FilbertJonathan E Williams, MD  ondansetron (ZOFRAN) 4 MG tablet Take 1 tablet (4 mg total) by mouth every 8 (eight) hours as needed for nausea or vomiting. Patient not taking: Reported on 04/06/2016 11/04/15   Jennye MoccasinBrian S Quigley, MD  phenazopyridine (PYRIDIUM) 200 MG tablet Take 1 tablet (200 mg total) by mouth 3 (three) times daily as needed for pain. Patient not taking: Reported on 04/06/2016 06/11/15 06/10/16  Emily FilbertJonathan E Williams, MD  sulfamethoxazole-trimethoprim (BACTRIM DS) 800-160 MG tablet Take 1 tablet by mouth 2 (two) times daily. Patient not taking: Reported on 04/06/2016 06/11/15   Emily FilbertJonathan E Williams, MD    Allergies Patient has no known allergies.  History reviewed. No pertinent family history.  Social History Social History  Substance Use Topics  . Smoking status: Never Smoker  . Smokeless tobacco: Never Used  . Alcohol use No    Review of Systems Constitutional: No fever/chills Eyes: No visual changes. ENT: No sore throat. Cardiovascular: Denies chest pain. Respiratory: Denies shortness of breath. Gastrointestinal: No nausea, no vomiting.  No constipation. Genitourinary: Negative for dysuria. Musculoskeletal: Negative for back pain. Skin: Negative for rash. Neurological: Negative for headaches, focal weakness or numbness.  10-point ROS otherwise negative.  ____________________________________________   PHYSICAL EXAM:  VITAL SIGNS: ED Triage Vitals  Enc Vitals Group     BP 04/06/16 0642 112/82     Pulse Rate 04/06/16 0642 83     Resp 04/06/16 0640 14     Temp 04/06/16 0642 99.2 F (37.3 C)     Temp Source 04/06/16 0642 Oral     SpO2 04/06/16 0642 98 %     Weight 04/06/16 0640 180 lb (81.6 kg)     Height 04/06/16 0640 4\' 10"  (1.473 m)     Head Circumference --      Peak Flow --       Pain Score 04/06/16 0640 8     Pain Loc --      Pain Edu? --      Excl. in GC? --     Constitutional: Alert and oriented. Well appearing and in no acute distress. Eyes: Conjunctivae are normal. PERRL. EOMI. Head: Atraumatic. Nose: No congestion/rhinnorhea. Mouth/Throat: Mucous membranes are moist.   Neck: No stridor.   Cardiovascular: Normal rate, regular rhythm. Grossly normal heart sounds.  Chest pain is reproducible over the site where the patient is reporting her pain. There is no crepitus. No deformity. The tenderness is over the left lateral chest wall and anterior axilla. No nodularity or lumps felt. Respiratory: Normal respiratory effort.  No retractions. Lungs CTAB. Gastrointestinal: Soft with epigastric as well as left upper and left lower quadrant tenderness which is moderate no rebound or guarding.. No distention. No CVA tenderness. Musculoskeletal: No lower extremity tenderness nor edema.  No joint effusions. Neurologic:  Normal speech and language. No gross focal neurologic deficits are appreciated.  Skin:  Skin is warm, dry and intact. No rash noted. Psychiatric: Mood and affect are normal. Speech and behavior are normal.  ____________________________________________   LABS (all labs ordered are listed, but only abnormal results are displayed)  Labs Reviewed  COMPREHENSIVE METABOLIC PANEL - Abnormal; Notable for the following:       Result Value   Glucose, Bld 103 (*)    All other components within normal limits  URINALYSIS COMPLETEWITH MICROSCOPIC ([facility] ONLY) - Abnormal; Notable for the following:    Color, Urine YELLOW (*)    APPearance CLEAR (*)    Protein, ur 30 (*)    Leukocytes, UA TRACE (*)    Squamous Epithelial / LPF 0-5 (*)    All other components within normal limits  URINE CULTURE  CBC  TROPONIN I  POC URINE PREG, ED  POCT PREGNANCY, URINE   ____________________________________________  EKG  ED ECG REPORT I, Arelia Longest, the  attending physician, personally viewed and interpreted this ECG.   Date: 04/06/2016  EKG Time: 0643  Rate: 86  Rhythm: normal sinus rhythm  Axis: Normal  Intervals:none  ST&T Change: No ST segment elevation or depression. No abnormal T-wave inversion.  ____________________________________________  RADIOLOGY    DG Chest 2 View (Final result)  Result time 04/06/16 08:03:07  Final result by Charlett Nose, MD (04/06/16 08:03:07)           Narrative:   CLINICAL DATA: Epigastric pain and pain under left breast for 2 days.  EXAM: CHEST 2 VIEW  COMPARISON: 07/25/2014  FINDINGS: Heart and mediastinal contours are within normal limits. No focal opacities or effusions. No acute bony abnormality.  IMPRESSION: No active cardiopulmonary disease.   Electronically Signed By: Charlett Nose M.D. On: 04/06/2016 08:03          ____________________________________________   PROCEDURES  Procedure(s) performed:   Procedures  Critical Care performed:  ____________________________________________   INITIAL IMPRESSION / ASSESSMENT AND PLAN / ED COURSE  Pertinent labs & imaging results that were available during my care of the patient were reviewed by me and considered in my medical decision making (see chart for details).  PERC negative.  Patient with multiple visits over the past year for abdominal pain. Had a CAT scan without any acute pathology in June. Symptoms seem to be chronic although the pain is upper today. Very reassuring lab workup. We'll check a chest x-ray. However, likely recommend outpatient follow-up with GI and primary care.  Clinical Course   ----------------------------------------- 10:01 AM on 04/06/2016 ----------------------------------------- No relief with GI cocktail and Toradol. Then given morphine and Zofran and patient feels much improved. Reexamined the abdomen which is nontender to palpation at this time. Very mild left-sided chest  tenderness which is improved.  Labs are very reassuring. Urine culture was sent. I feel that her pain is likely chronic as she has had multiple visits including a CAT scan this June which did not show any acute pathology. I recommended outpatient follow-up with GI as well as primary care. We also discussed return precautions to come back to the emergency department for any worsening or concerning symptoms . We discussed at that point that if her symptoms return or worsen that we will most likely need to do a repeat CAT scan. The patient understands plan and is willing to comply. Will be discharged home. Chest pain most likely is related to chest wall pain.    ____________________________________________   FINAL CLINICAL IMPRESSION(S) / ED DIAGNOSES  Chest pain. Abdominal pain.    NEW MEDICATIONS STARTED DURING THIS VISIT:  New Prescriptions   No medications on file     Note:  This document was prepared using Dragon voice recognition software and may include unintentional dictation errors.    Myrna Blazeravid Matthew Schaevitz, MD 04/06/16 1005  Patient's prescriptions not placed on discharge instructions. I attempted to call the patient but her phone number listed on her demographic section was out of service. I called her grandmother's home number and her grandmother picked up. The grandmother, without me telling her, new that the patient had been seen in the hospital.  I relayed the message to the grandmother that the patient's prescriptions would be at the front desk in the emergency department at the patient to pick him up at any time.  GrandMother said that she would relay the message.   Myrna Blazeravid Matthew Schaevitz, MD 04/06/16 [phone]

## 2016-04-08 LAB — URINE CULTURE: Culture: <10000 — AB

## 2017-07-05 IMAGING — CT CT RENAL STONE PROTOCOL
3 of 4 series · 10 of 46 positions shown, 17 images · non-contrast
Comparison: None.

CLINICAL DATA: Pt states N/V/D and lower abd/pelvic pain started at
2am today. Pt with hx of cholecystectomy and c-section. Low Dose 1
study used per protocol.

EXAM:
CT ABDOMEN AND PELVIS WITHOUT CONTRAST
TECHNIQUE: Multidetector CT imaging of the abdomen and pelvis was performed
following the standard protocol without IV contrast.

[Series 4: lung · axial · 0.70mm/px · z∈[-619,-534]mm · 6 of 25 slices shown, 11 images]
[im 4/25  soft-tissue]
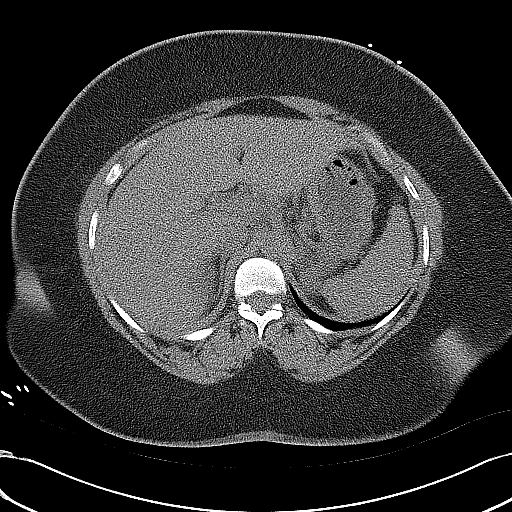
[im 4/25  bone]
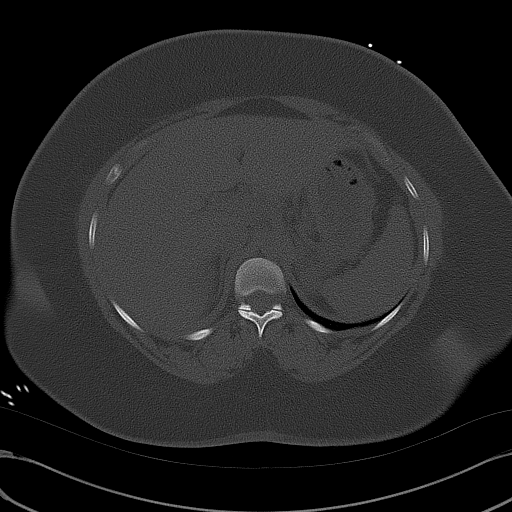
[im 7/25  soft-tissue]
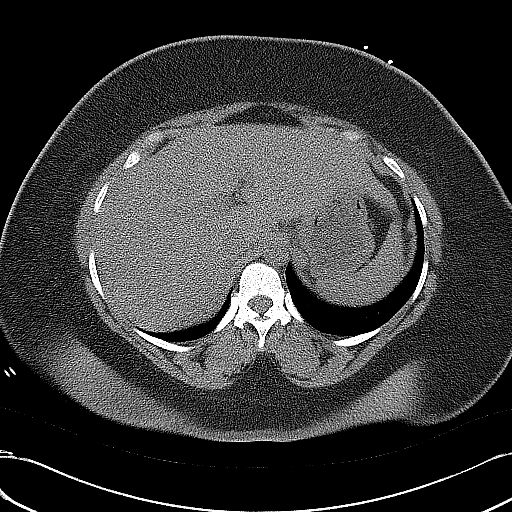
[im 11/25  soft-tissue]
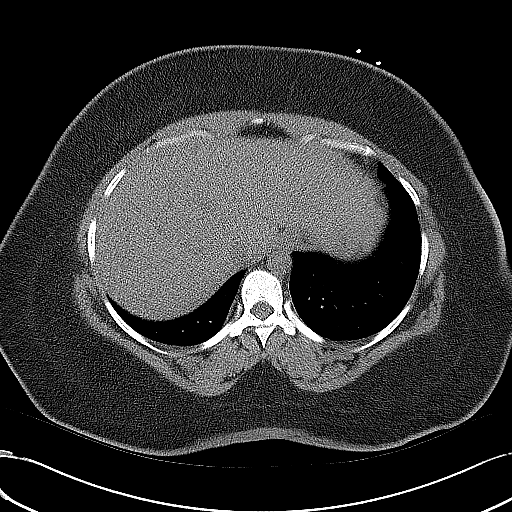
[im 11/25  lung]
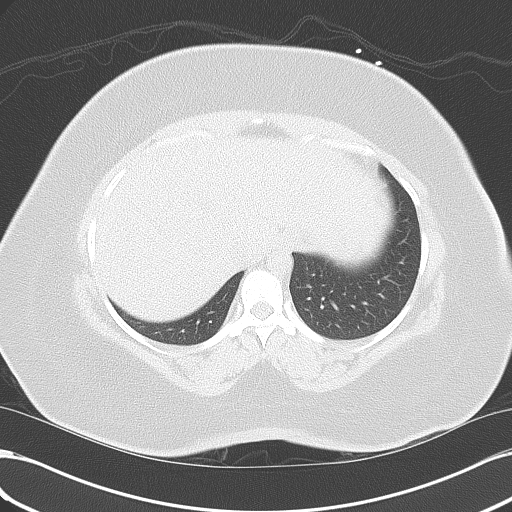
[im 14/25  soft-tissue]
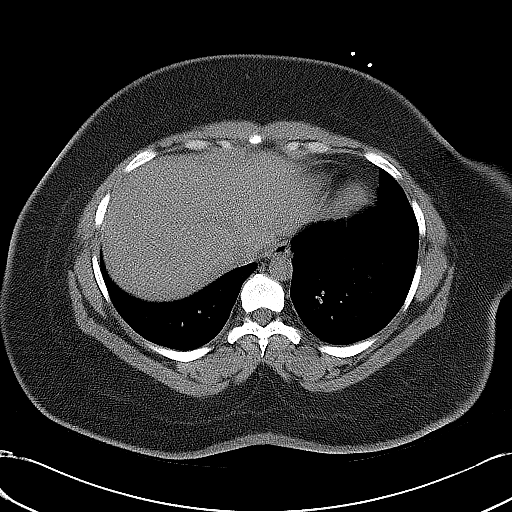
[im 14/25  lung]
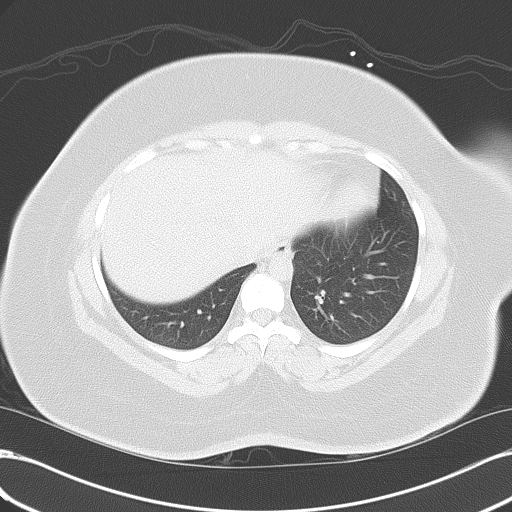
[im 18/25  soft-tissue]
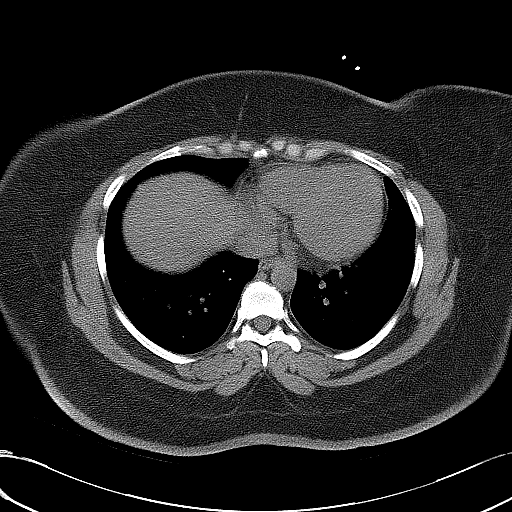
[im 18/25  lung]
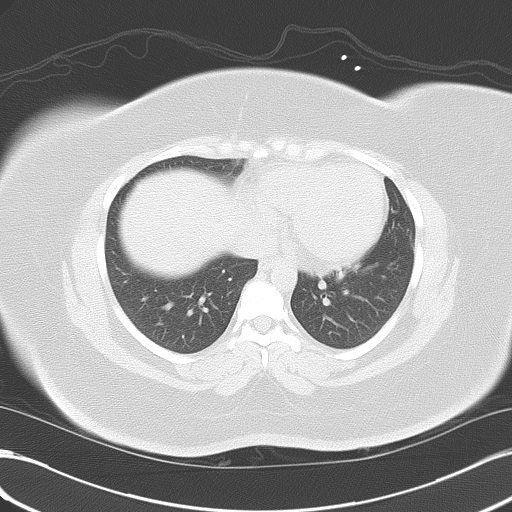
[im 21/25  soft-tissue]
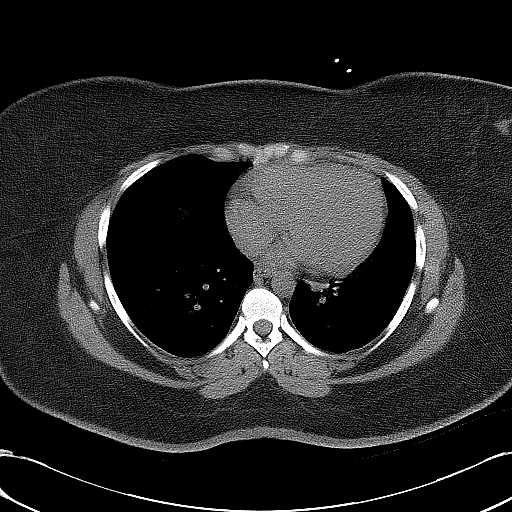
[im 21/25  lung]
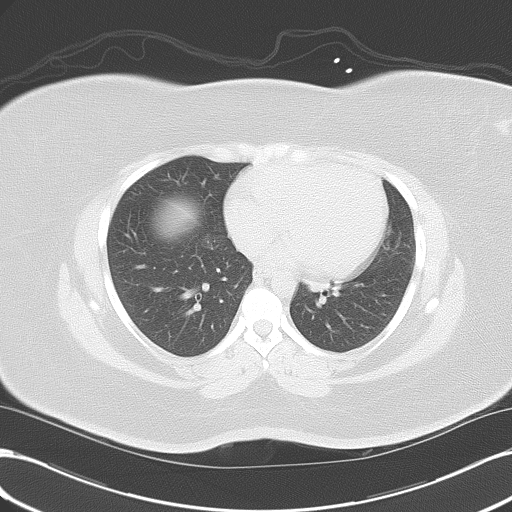

[Series 5: coronal · coronal · 0.68mm/px · 3 of 150 slices shown, 4 images]
[im 50/150  soft-tissue]
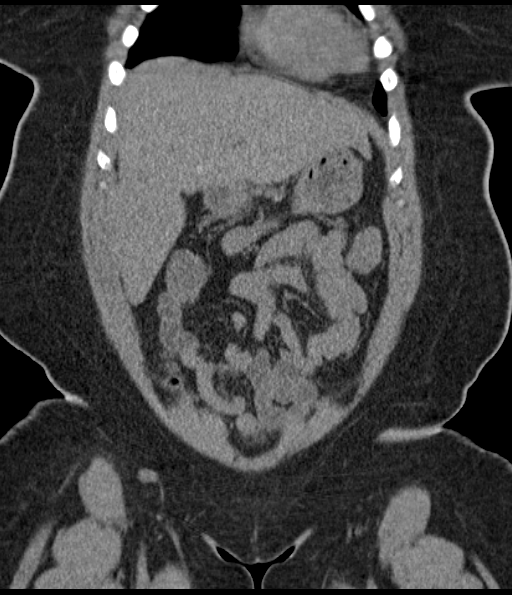
[im 67/150  soft-tissue]
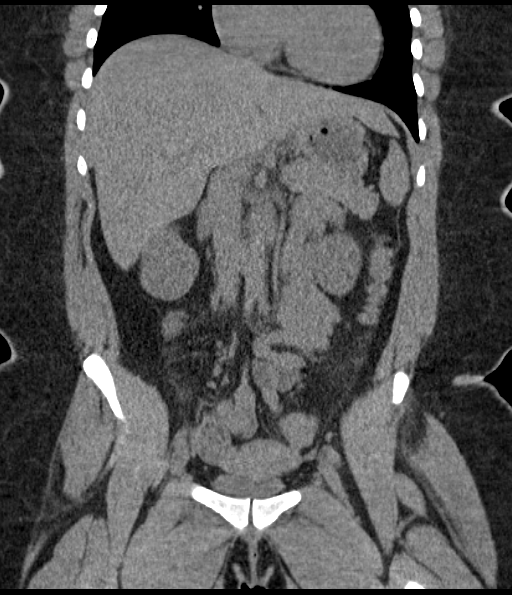
[im 67/150  bone]
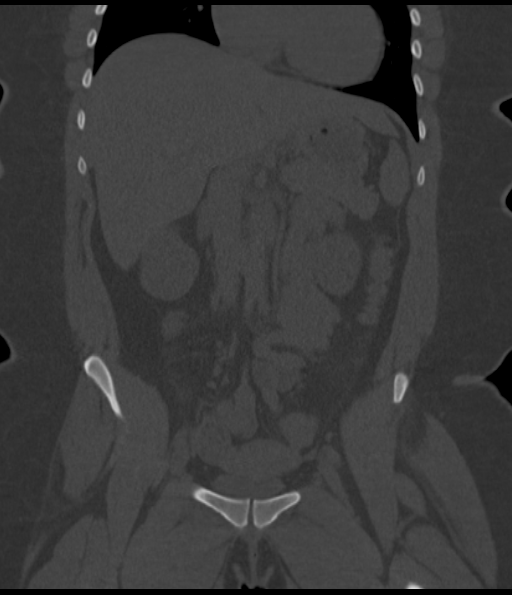
[im 83/150  soft-tissue]
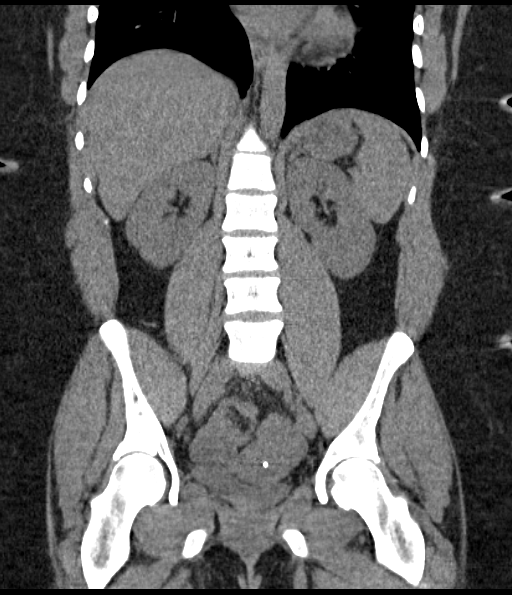

[Series 6: sagittal · sagittal · 0.59mm/px · 1 of 170 slices shown, 2 images]
[im 57/170  soft-tissue]
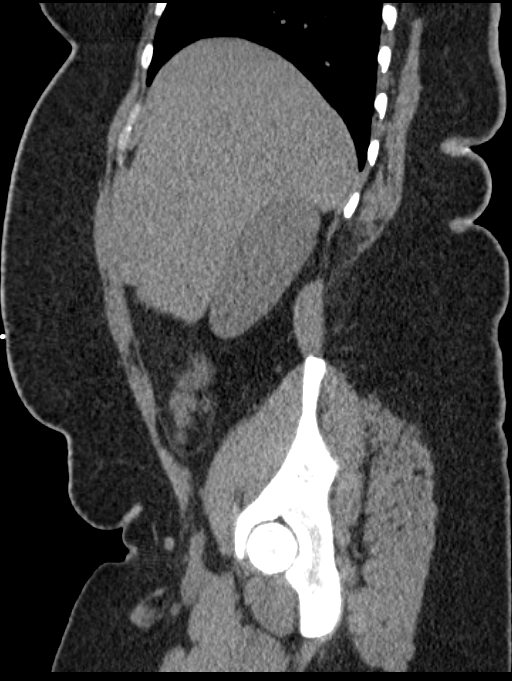
[im 57/170  bone]
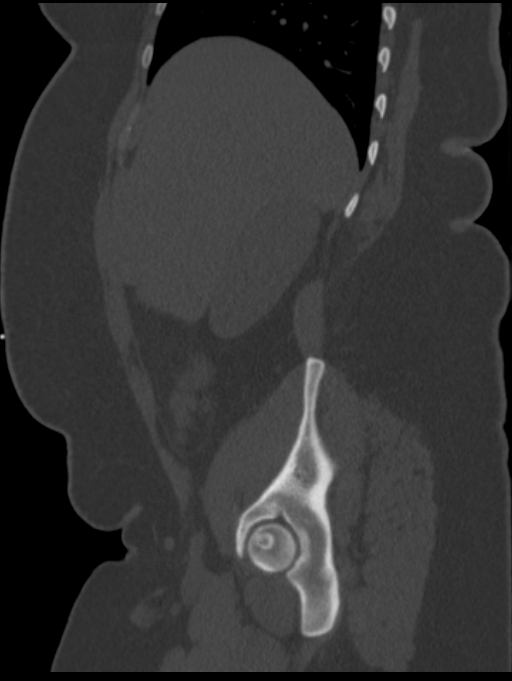

[10 of 46 positions shown; findings below may reference images not displayed]

FINDINGS: Lung bases: Clear.  Heart normal in size.

Hepatobiliary: Mild fatty infiltration of the liver. No mass or
focal lesion. Gallbladder surgically absent. No bile duct dilation.

Spleen, pancreas, adrenal glands:  Unremarkable.

Kidneys, ureters, bladder:  Normal.

Uterus and adnexa:  Well-positioned IUD.  Otherwise unremarkable.

Lymph nodes:  No adenopathy.

Ascites:  None.

Gastrointestinal:  Unremarkable.  Normal appendix visualized.

Musculoskeletal:  Unremarkable.
IMPRESSION: 1. No acute findings. No findings to account for this patient's
symptoms.
2. Mild hepatic steatosis. Status post cholecystectomy. No other
abnormalities.

## 2017-12-06 IMAGING — CR DG CHEST 2V
2 series · 2 of 2 positions shown · non-contrast
Comparison: 07/25/2014

CLINICAL DATA: Epigastric pain and pain under left breast for 2
days.

EXAM:
CHEST  2 VIEW

[chest pa]
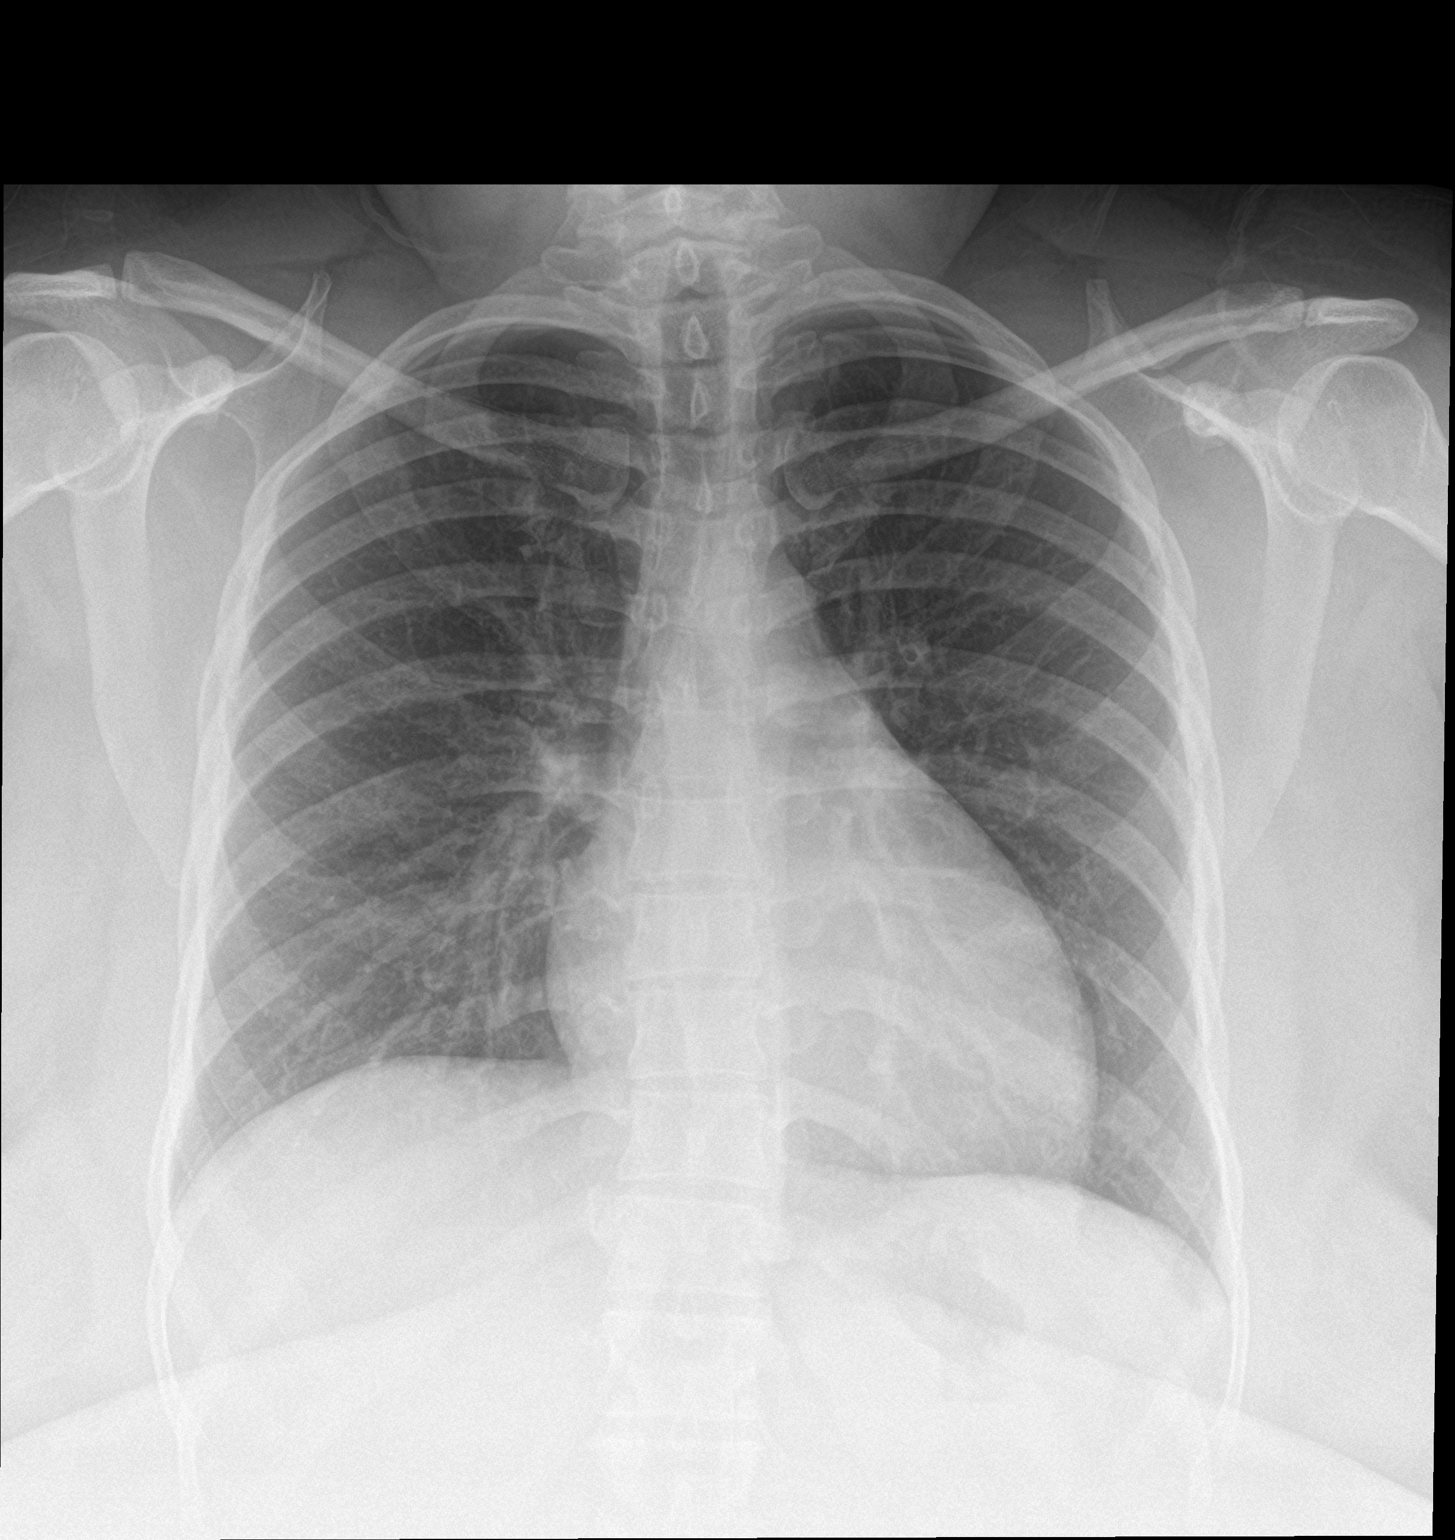

[chest lat]
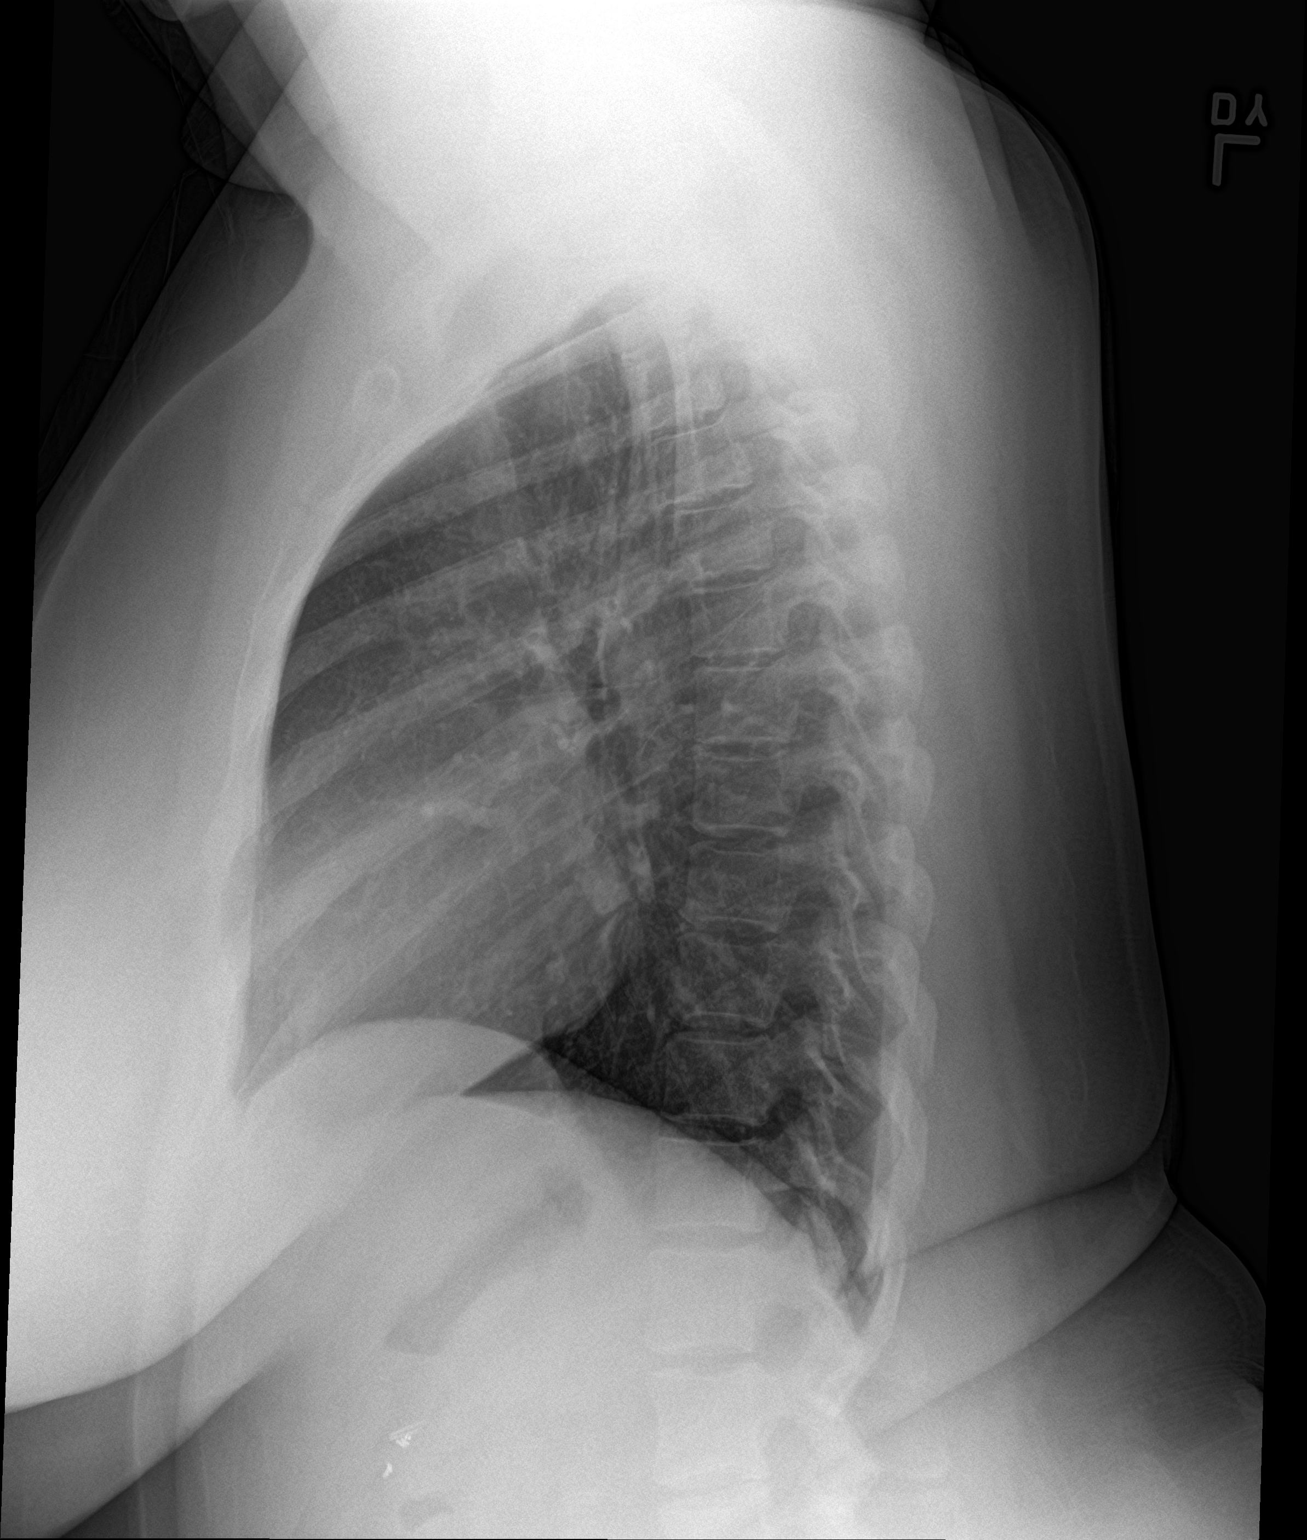

[2 of 2 positions shown; findings below may reference images not displayed]

FINDINGS: Heart and mediastinal contours are within normal limits. No focal
opacities or effusions. No acute bony abnormality.
IMPRESSION: No active cardiopulmonary disease.

## 2018-07-19 ENCOUNTER — Other Ambulatory Visit: Payer: Self-pay

## 2018-07-19 ENCOUNTER — Emergency Department
Admission: EM | Admit: 2018-07-19 | Discharge: 2018-07-19 | Disposition: A | Discharge status: Home / Self Care | Payer: Self-pay | Attending: Emergency Medicine | Admitting: Emergency Medicine

## 2018-07-19 ENCOUNTER — Encounter: Payer: Self-pay | Admitting: *Deleted

## 2018-07-19 ENCOUNTER — Emergency Department: Payer: Self-pay

## 2018-07-19 DIAGNOSIS — J988 Other specified respiratory disorders: Secondary | ICD-10-CM | POA: Insufficient documentation

## 2018-07-19 DIAGNOSIS — R51 Headache: Secondary | ICD-10-CM | POA: Insufficient documentation

## 2018-07-19 DIAGNOSIS — R062 Wheezing: Secondary | ICD-10-CM | POA: Insufficient documentation

## 2018-07-19 DIAGNOSIS — R6883 Chills (without fever): Secondary | ICD-10-CM | POA: Insufficient documentation

## 2018-07-19 DIAGNOSIS — R197 Diarrhea, unspecified: Secondary | ICD-10-CM | POA: Insufficient documentation

## 2018-07-19 DIAGNOSIS — Z79899 Other long term (current) drug therapy: Secondary | ICD-10-CM | POA: Insufficient documentation

## 2018-07-19 DIAGNOSIS — M791 Myalgia, unspecified site: Secondary | ICD-10-CM | POA: Insufficient documentation

## 2018-07-19 DIAGNOSIS — B9789 Other viral agents as the cause of diseases classified elsewhere: Secondary | ICD-10-CM | POA: Insufficient documentation

## 2018-07-19 LAB — INFLUENZA PANEL BY PCR (TYPE A & B)
INFLBPCR: POSITIVE — AB
Influenza A By PCR: NEGATIVE

## 2018-07-19 MED ORDER — IPRATROPIUM-ALBUTEROL 0.5-2.5 (3) MG/3ML IN SOLN
3.0000 mL | Freq: Once | RESPIRATORY_TRACT | Status: AC
  Administered 2018-07-19: 3 mL via RESPIRATORY_TRACT
  Filled 2018-07-19: qty 3

## 2018-07-19 MED ORDER — PREDNISONE 20 MG PO TABS: 40.0000 mg | ORAL_TABLET | Freq: Every day | ORAL | 0 refills | Status: AC

## 2018-07-19 MED ORDER — ALBUTEROL SULFATE HFA 108 (90 BASE) MCG/ACT IN AERS: 2.0000 | INHALATION_SPRAY | Freq: Four times a day (QID) | RESPIRATORY_TRACT | 2 refills | Status: AC | PRN

## 2018-07-19 MED ORDER — IBUPROFEN 600 MG PO TABS: 600.0000 mg | ORAL_TABLET | Freq: Four times a day (QID) | ORAL | 0 refills | Status: AC | PRN

## 2018-07-19 MED ORDER — IBUPROFEN 800 MG PO TABS
800.0000 mg | ORAL_TABLET | Freq: Once | ORAL | Status: AC
  Administered 2018-07-19: 800 mg via ORAL
  Filled 2018-07-19: qty 1

## 2018-07-19 MED ORDER — FLUTICASONE PROPIONATE 50 MCG/ACT NA SUSP: 1.0000 | Freq: Every day | NASAL | 0 refills | Status: AC

## 2018-07-19 MED ORDER — HYDROCOD POLST-CPM POLST ER 10-8 MG/5ML PO SUER: 5.0000 mL | Freq: Every evening | ORAL | 0 refills | Status: AC | PRN

## 2018-07-19 NOTE — Discharge Instructions (Addendum)
Please take medications as prescribed.  Make sure you are drinking lots of fluids.  If any shortness of breath, chest pain, fevers above 102 or worsening cough return to the emergency department.

## 2018-07-19 NOTE — ED Provider Notes (Signed)
[facility] EMERGENCY DEPARTMENT Provider Note   CSN: [id number] Arrival date & time: 07/19/18  1856    History   Chief Complaint Chief Complaint  Patient presents with  . Illness    HPI Patricia Washington is a 33 y.o. female presents to the emergency department for evaluation of cough, chills, body aches, headache, diarrhea.  Symptoms been present for 3 days.  No travel outside of the [place].  Dry nonproductive cough.  She denies any chest pain or shortness of breath but does have some mild wheezing with a history of asthma.  She denies any lower extremity swelling, history of DVTs, blood clots or hormone use.  She is tolerating p.o. well.  She denies any abdominal pain nausea vomiting.  She has had a couple episodes of diarrhea that began yesterday.  She denies any neck pain.  Last took Tylenol at 5 PM today.  She has not had any ibuprofen.  She denies any fevers or chills.     HPI  History reviewed. No pertinent past medical history.  There are no active problems to display for this patient.   Past Surgical History:  Procedure Laterality Date  . CESAREAN SECTION    . CHOLECYSTECTOMY       OB History   No obstetric history on file.      Home Medications    Prior to Admission medications   Medication Sig Start Date End Date Taking? Authorizing Provider  albuterol (PROVENTIL HFA;VENTOLIN HFA) 108 (90 Base) MCG/ACT inhaler Inhale 2 puffs into the lungs every 6 (six) hours as needed for wheezing or shortness of breath. 07/19/18   Evon Slack, PA-C  carisoprodol (SOMA) 350 MG tablet Take 1 tablet (350 mg total) by mouth 3 (three) times daily as needed. 04/06/16   Myrna Blazer, MD  chlorpheniramine-HYDROcodone (TUSSIONEX PENNKINETIC ER) 10-8 MG/5ML SUER Take 5 mLs by mouth at bedtime as needed for cough. 07/19/18   Evon Slack, PA-C  dicyclomine (BENTYL) 20 MG tablet Take 1 tablet (20 mg total) by mouth 3 (three) times daily as needed for  spasms. 11/04/15   Jennye Moccasin, MD  famotidine (PEPCID) 40 MG tablet Take 1 tablet (40 mg total) by mouth every evening. 04/06/16 04/06/17  Myrna Blazer, MD  fluticasone (FLONASE) 50 MCG/ACT nasal spray Place 1 spray into both nostrils daily. 07/19/18 07/19/19  Evon Slack, PA-C  ibuprofen (ADVIL,MOTRIN) 600 MG tablet Take 1 tablet (600 mg total) by mouth every 6 (six) hours as needed for moderate pain. 07/19/18   Evon Slack, PA-C  naproxen sodium (ANAPROX) 220 MG tablet Take 440 mg by mouth 2 (two) times daily with a meal.    [provider]  ondansetron (ZOFRAN) 4 MG tablet Take 1 tablet (4 mg total) by mouth every 8 (eight) hours as needed for nausea or vomiting. Patient not taking: Reported on 04/06/2016 11/04/15   Jennye Moccasin, MD  predniSONE (DELTASONE) 20 MG tablet Take 2 tablets (40 mg total) by mouth daily. 07/19/18   Evon Slack, PA-C  sulfamethoxazole-trimethoprim (BACTRIM DS) 800-160 MG tablet Take 1 tablet by mouth 2 (two) times daily. Patient not taking: Reported on 04/06/2016 06/11/15   Emily Filbert, MD    Family History No family history on file.  Social History Social History   Tobacco Use  . Smoking status: Never Smoker  . Smokeless tobacco: Never Used  Substance Use Topics  . Alcohol use: No  . Drug use:  No     Allergies   Patient has no known allergies.   Review of Systems Review of Systems  Constitutional: Positive for chills. Negative for fever.  HENT: Positive for congestion, rhinorrhea and sore throat. Negative for ear discharge, sinus pressure, sinus pain, trouble swallowing and voice change.   Respiratory: Positive for cough. Negative for shortness of breath, wheezing and stridor.   Cardiovascular: Negative for chest pain.  Gastrointestinal: Positive for diarrhea. Negative for abdominal pain, nausea and vomiting.  Genitourinary: Negative for dysuria, flank pain and pelvic pain.  Musculoskeletal: Positive for  myalgias. Negative for back pain.  Skin: Negative for rash.  Neurological: Negative for dizziness and headaches.     Physical Exam Updated Vital Signs BP 139/82   Pulse 99   Temp 99.8 F (37.7 C) (Oral)   Resp 20   Wt 86.2 kg   SpO2 98%   BMI 39.71 kg/m   Physical Exam Constitutional:      General: She is not in acute distress.    Appearance: She is well-developed.  HENT:     Head: Normocephalic and atraumatic.     Jaw: No trismus.     Right Ear: Hearing, tympanic membrane, ear canal and external ear normal.     Left Ear: Hearing, tympanic membrane, ear canal and external ear normal.     Nose: Rhinorrhea present.     Mouth/Throat:     Pharynx: Posterior oropharyngeal erythema present. No oropharyngeal exudate or uvula swelling.     Tonsils: No tonsillar exudate or tonsillar abscesses.  Eyes:     General:        Right eye: No discharge.        Left eye: No discharge.     Conjunctiva/sclera: Conjunctivae normal.  Neck:     Musculoskeletal: Normal range of motion.  Cardiovascular:     Rate and Rhythm: Normal rate and regular rhythm.     Pulses: Normal pulses.  Pulmonary:     Effort: Pulmonary effort is normal. No respiratory distress.     Breath sounds: No stridor. Wheezing present. No rhonchi or rales.  Abdominal:     General: There is no distension.     Palpations: Abdomen is soft.     Tenderness: There is no abdominal tenderness. There is no guarding.  Musculoskeletal: Normal range of motion.        General: No deformity.  Lymphadenopathy:     Cervical: Cervical adenopathy present.  Skin:    General: Skin is warm and dry.  Neurological:     Mental Status: She is alert and oriented to person, place, and time.     Deep Tendon Reflexes: Reflexes are normal and symmetric.  Psychiatric:        Behavior: Behavior normal.        Thought Content: Thought content normal.      ED Treatments / Results  Labs (all labs ordered are listed, but only abnormal results  are displayed) Labs Reviewed  INFLUENZA PANEL BY PCR (TYPE A & B)    EKG None  Radiology Dg Chest 2 View  Result Date: 07/19/2018 CLINICAL DATA:  Flu like illness. EXAM: CHEST - 2 VIEW COMPARISON:  April 06, 2016 FINDINGS: The heart size and mediastinal contours are within normal limits. Both lungs are clear. The visualized skeletal structures are unremarkable. IMPRESSION: No active cardiopulmonary disease. Electronically Signed   By: Gerome Sam III M.D   On: 07/19/2018 20:36    Procedures Procedures (including critical care  time)  Medications Ordered in ED Medications  ipratropium-albuterol (DUONEB) 0.5-2.5 (3) MG/3ML nebulizer solution 3 mL (3 mLs Nebulization Given 07/19/18 2031)  ibuprofen (ADVIL,MOTRIN) tablet 800 mg (800 mg Oral Given 07/19/18 2031)     Initial Impression / Assessment and Plan / ED Course  I have reviewed the triage vital signs and the nursing notes.  Pertinent labs & imaging results that were available during my care of the patient were reviewed by me and considered in my medical decision making (see chart for details).        33 year old female with viral respiratory illness for 3 days.  On exam she had mild wheezing and dry cough that completely resolved with DuoNeb treatment x1.  Vital signs stable.  Patient much improved after DuoNeb treatment.  No chest pain or shortness of breath.  No risk factors for PE.  Patient given prescription for prednisone, albuterol, nighttime cough medication and Flonase.  She will continue with over-the-counter cough and cold medicine.  She understands signs symptoms return to the ED for.  Final Clinical Impressions(s) / ED Diagnoses   Final diagnoses:  Viral respiratory illness  Wheezing    ED Discharge Orders         Ordered    ibuprofen (ADVIL,MOTRIN) 600 MG tablet  Every 6 hours PRN     07/19/18 2123    albuterol (PROVENTIL HFA;VENTOLIN HFA) 108 (90 Base) MCG/ACT inhaler  Every 6 hours PRN     07/19/18  2123    predniSONE (DELTASONE) 20 MG tablet  Daily     07/19/18 2123    chlorpheniramine-HYDROcodone (TUSSIONEX PENNKINETIC ER) 10-8 MG/5ML SUER  At bedtime PRN     07/19/18 2123    fluticasone (FLONASE) 50 MCG/ACT nasal spray  Daily     07/19/18 2123           Ronnette Juniper 07/19/18 2126    Emily Filbert, MD 07/19/18 2249

## 2018-07-19 NOTE — ED Triage Notes (Signed)
Pt is here with flu like illness since Friday. Pt reports cough and cold since Friday with body aches and nausea, vomiting and diarrhea which began yesterday.  No travel out of the country.

## 2018-07-19 NOTE — ED Notes (Signed)
Patient transported to X-ray 

## 2018-08-08 ENCOUNTER — Emergency Department
Admission: EM | Admit: 2018-08-08 | Discharge: 2018-08-08 | Disposition: A | Discharge status: Home / Self Care | Payer: Self-pay | Attending: Emergency Medicine | Admitting: Emergency Medicine

## 2018-08-08 ENCOUNTER — Encounter: Payer: Self-pay | Admitting: Emergency Medicine

## 2018-08-08 ENCOUNTER — Other Ambulatory Visit: Payer: Self-pay

## 2018-08-08 DIAGNOSIS — B349 Viral infection, unspecified: Secondary | ICD-10-CM | POA: Insufficient documentation

## 2018-08-08 DIAGNOSIS — R197 Diarrhea, unspecified: Secondary | ICD-10-CM

## 2018-08-08 DIAGNOSIS — R1084 Generalized abdominal pain: Secondary | ICD-10-CM | POA: Insufficient documentation

## 2018-08-08 DIAGNOSIS — R112 Nausea with vomiting, unspecified: Secondary | ICD-10-CM | POA: Insufficient documentation

## 2018-08-08 MED ORDER — ONDANSETRON 4 MG PO TBDP: ORAL_TABLET | ORAL | 0 refills | Status: DC

## 2018-08-08 MED ORDER — BENZONATATE 100 MG PO CAPS: 100.0000 mg | ORAL_CAPSULE | Freq: Three times a day (TID) | ORAL | 0 refills | Status: DC | PRN

## 2018-08-08 MED ORDER — HYDROCODONE-HOMATROPINE 5-1.5 MG/5ML PO SYRP: 5.0000 mL | ORAL_SOLUTION | Freq: Four times a day (QID) | ORAL | 0 refills | Status: AC | PRN

## 2018-08-08 NOTE — Discharge Instructions (Addendum)

## 2018-08-08 NOTE — ED Triage Notes (Signed)
Pt c/o cough, congestion, headache and SOB since yesterday. Denies any fever. NAD noted. VSS , + productive cough

## 2018-08-08 NOTE — ED Notes (Addendum)
Pt A&O, ambulatory. No distress noted. Mask in place.   Pt states "I feel weak, have diarrhea, pain here (points to epigastric) and cough" cough yesterday, diarrhea x 2 days (5 episodes a day). States vomited x 2 last night. Also c/o SOB from walking. Denies recent travel. Denies any one in house sick. Denies fever. Denies urinary symptoms.

## 2018-08-08 NOTE — ED Provider Notes (Signed)
[facility] Emergency Department Provider Note  ____________________________________________   First MD Initiated Contact with Patient 08/08/18 1527     (approximate)  I have reviewed the triage vital signs and the nursing notes.   HISTORY  Chief Complaint Cough    HPI Patricia Washington is a 33 y.o. female who reports no chronic medical issues and presents for evaluation of general malaise and feeling generalized weakness as well as a couple of days of diarrhea, some intermittent abdominal pain both in the upper and lower parts of her abdomen, one episode of vomiting last night, and some cough.  The cough started yesterday.  She does not smoke.  She denies shortness of breath except for when she is exerting herself.  She has not had any recent travel and has had no contact with anyone known to be high risk or testing positive for COVID-19.  She has not had any fever or chills.  She denies chest pain.  She currently does not have any nausea.  She reports  no dysuria and no other family members have been ill.  She has had similar symptoms in the past but without as much abdominal discomfort.  She has had a cholecystectomy about 10 years ago.  She does not drink alcohol to excess.        History reviewed. No pertinent past medical history.  There are no active problems to display for this patient.   Past Surgical History:  Procedure Laterality Date  . CESAREAN SECTION    . CHOLECYSTECTOMY      Prior to Admission medications   Medication Sig Start Date End Date Taking? Authorizing Provider  albuterol (PROVENTIL HFA;VENTOLIN HFA) 108 (90 Base) MCG/ACT inhaler Inhale 2 puffs into the lungs every 6 (six) hours as needed for wheezing or shortness of breath. 07/19/18   Evon Slack, PA-C  benzonatate (TESSALON PERLES) 100 MG capsule Take 1 capsule (100 mg total) by mouth 3 (three) times daily as needed for cough. 08/08/18   Loleta Rose, MD  carisoprodol (SOMA)  350 MG tablet Take 1 tablet (350 mg total) by mouth 3 (three) times daily as needed. 04/06/16   Myrna Blazer, MD  chlorpheniramine-HYDROcodone (TUSSIONEX PENNKINETIC ER) 10-8 MG/5ML SUER Take 5 mLs by mouth at bedtime as needed for cough. 07/19/18   Evon Slack, PA-C  dicyclomine (BENTYL) 20 MG tablet Take 1 tablet (20 mg total) by mouth 3 (three) times daily as needed for spasms. 11/04/15   Jennye Moccasin, MD  famotidine (PEPCID) 40 MG tablet Take 1 tablet (40 mg total) by mouth every evening. 04/06/16 04/06/17  Myrna Blazer, MD  fluticasone (FLONASE) 50 MCG/ACT nasal spray Place 1 spray into both nostrils daily. 07/19/18 07/19/19  Evon Slack, PA-C  HYDROcodone-homatropine (HYCODAN) 5-1.5 MG/5ML syrup Take 5 mLs by mouth every 6 (six) hours as needed for cough. 08/08/18   Loleta Rose, MD  ibuprofen (ADVIL,MOTRIN) 600 MG tablet Take 1 tablet (600 mg total) by mouth every 6 (six) hours as needed for moderate pain. 07/19/18   Evon Slack, PA-C  naproxen sodium (ANAPROX) 220 MG tablet Take 440 mg by mouth 2 (two) times daily with a meal.    [provider]  ondansetron (ZOFRAN ODT) 4 MG disintegrating tablet Allow 1-2 tablets to dissolve in your mouth every 8 hours as needed for nausea/vomiting 08/08/18   Loleta Rose, MD  ondansetron (ZOFRAN) 4 MG tablet Take 1 tablet (4 mg total) by mouth every  8 (eight) hours as needed for nausea or vomiting. Patient not taking: Reported on 04/06/2016 11/04/15   Jennye Moccasin, MD  predniSONE (DELTASONE) 20 MG tablet Take 2 tablets (40 mg total) by mouth daily. 07/19/18   Evon Slack, PA-C  sulfamethoxazole-trimethoprim (BACTRIM DS) 800-160 MG tablet Take 1 tablet by mouth 2 (two) times daily. Patient not taking: Reported on 04/06/2016 06/11/15   Emily Filbert, MD    Allergies Patient has no known allergies.  No family history on file.  Social History Social History   Tobacco Use  . Smoking status: Never  Smoker  . Smokeless tobacco: Never Used  Substance Use Topics  . Alcohol use: No  . Drug use: No    Review of Systems Constitutional: General malaise and generalized weakness.  No fever/chills Eyes: No visual changes. ENT: No sore throat. Cardiovascular: Denies chest pain. Respiratory: Shortness of breath with exertion.  Mild cough starting yesterday as described above. Gastrointestinal: Intermittent abdominal pain both in the upper and lower part of her abdomen.  One episode of vomiting yesterday.  Several episodes of diarrhea. Genitourinary: Negative for dysuria. Musculoskeletal: Negative for neck pain.  Negative for back pain. Integumentary: Negative for rash. Neurological: Negative for headaches, focal weakness or numbness.   ____________________________________________   PHYSICAL EXAM:  VITAL SIGNS: ED Triage Vitals [08/08/18 1521]  Enc Vitals Group     BP (!) 143/91     Pulse Rate 80     Resp 16     Temp 98.5 F (36.9 C)     Temp Source Oral     SpO2 98 %     Weight      Height      Head Circumference      Peak Flow      Pain Score      Pain Loc      Pain Edu?      Excl. in GC?     Constitutional: Alert and oriented. Well appearing and in no acute distress. Eyes: Conjunctivae are normal.  Head: Atraumatic. Nose: No congestion/rhinnorhea. Mouth/Throat: Mucous membranes are moist. Neck: No stridor.  No meningeal signs.   Cardiovascular: Normal rate, regular rhythm. Good peripheral circulation. Grossly normal heart sounds. Respiratory: Normal respiratory effort.  No retractions. Lungs CTAB. Gastrointestinal: Soft and nondistended.  Mild diffuse tenderness to palpation throughout without any focal tenderness. Musculoskeletal: No lower extremity tenderness nor edema. No gross deformities of extremities. Neurologic:  Normal speech and language. No gross focal neurologic deficits are appreciated.  Skin:  Skin is warm, dry and intact. No rash noted. Psychiatric:  Mood and affect are normal. Speech and behavior are normal.  ____________________________________________   LABS (all labs ordered are listed, but only abnormal results are displayed)  Labs Reviewed - No data to display ____________________________________________  EKG  None - EKG not ordered by ED physician ____________________________________________  RADIOLOGY   ED MD interpretation: No indication for imaging  Official radiology report(s): No results found.  ____________________________________________   PROCEDURES   Procedure(s) performed (including Critical Care):  Procedures   ____________________________________________   INITIAL IMPRESSION / MDM / ASSESSMENT AND PLAN / ED COURSE  As part of my medical decision making, I reviewed the following data within the electronic MEDICAL RECORD NUMBER Nursing notes reviewed and incorporated, Old chart reviewed, Notes from prior ED visits and [place] Controlled Substance Database         Differential diagnosis includes, but is not limited to, nonspecific viral illness, pneumonia, foodborne infection,  diverticulitis, UTI/pyelonephritis, STD/PID, much less likely COVID-19.  The patient is very well-appearing and in no distress.  She is ambulating quickly and is in no distress.  Even though she reports some shortness of breath with exertion she is having no difficulty with ambulate around the emergency department, going to the bathroom, getting up and down out of her exam bed, etc.  Her abdominal exam is generally reassuring with some diffuse tenderness but no specific focal tenderness.  Her vital signs are completely stable and within normal limits except for some very mild hypertension.  Her lung sounds are completely clear and I did not appreciate any cough during the examination and history.  I reviewed the medical record and she has had prescriptions for Bentyl, Zofran, famotidine, naproxen, ibuprofen, cough medicine, and Soma in  the past, all of which suggest some issues with abdominal discomfort as well as intermittent pain syndromes.  She was seen in this emergency department about 3 weeks ago for similar viral symptoms.  Explained to the patient she is at very low risk for COVID-19 and that there is no indication for any additional testing or treatment.  All of her symptoms sound related to a viral illness and I do not feel she would benefit from a CT scan, ultrasound, or lab work at this time.  She is comfortable with the plan for discharge and return to the emergency department if her symptoms get worse.  I am providing a prescription for cough medicine, Tessalon Perles, and Zofran and I gave my usual customary management recommendations as well as strict return precautions.  She understands and agrees with the plan.  At this time there is no evidence of an acute or emergent medical condition requiring further evaluation or treatment.     ____________________________________________  FINAL CLINICAL IMPRESSION(S) / ED DIAGNOSES  Final diagnoses:  Viral illness  Nausea vomiting and diarrhea  Generalized abdominal pain     MEDICATIONS GIVEN DURING THIS VISIT:  Medications - No data to display   ED Discharge Orders         Ordered    ondansetron (ZOFRAN ODT) 4 MG disintegrating tablet     08/08/18 1545    HYDROcodone-homatropine (HYCODAN) 5-1.5 MG/5ML syrup  Every 6 hours PRN     08/08/18 1545    benzonatate (TESSALON PERLES) 100 MG capsule  3 times daily PRN     08/08/18 1545           Note:  This document was prepared using Dragon voice recognition software and may include unintentional dictation errors.   Loleta Rose, MD 08/08/18 [phone]

## 2018-11-11 ENCOUNTER — Encounter: Payer: Self-pay | Admitting: Intensive Care

## 2018-11-11 ENCOUNTER — Other Ambulatory Visit: Payer: Self-pay

## 2018-11-11 ENCOUNTER — Emergency Department
Admission: EM | Admit: 2018-11-11 | Discharge: 2018-11-11 | Disposition: A | Discharge status: Home / Self Care | Payer: Self-pay | Attending: Emergency Medicine | Admitting: Emergency Medicine

## 2018-11-11 DIAGNOSIS — Z79899 Other long term (current) drug therapy: Secondary | ICD-10-CM | POA: Insufficient documentation

## 2018-11-11 DIAGNOSIS — N12 Tubulo-interstitial nephritis, not specified as acute or chronic: Secondary | ICD-10-CM | POA: Insufficient documentation

## 2018-11-11 DIAGNOSIS — F17290 Nicotine dependence, other tobacco product, uncomplicated: Secondary | ICD-10-CM | POA: Insufficient documentation

## 2018-11-11 LAB — URINALYSIS, COMPLETE (UACMP) WITH MICROSCOPIC
Bilirubin Urine: NEGATIVE
Glucose, UA: NEGATIVE mg/dL
Ketones, ur: NEGATIVE mg/dL
Nitrite: POSITIVE — AB
Protein, ur: 100 mg/dL — AB
RBC / HPF: >50 RBC/hpf — ABNORMAL HIGH (ref 0–5)
Specific Gravity, Urine: 1.031 — ABNORMAL HIGH (ref 1.005–1.030)
WBC, UA: >50 WBC/hpf — ABNORMAL HIGH (ref 0–5)
pH: 6 (ref 5.0–8.0)

## 2018-11-11 LAB — BASIC METABOLIC PANEL
Anion gap: 9 (ref 5–15)
BUN: 16 mg/dL (ref 6–20)
CO2: 23 mmol/L (ref 22–32)
Calcium: 9.1 mg/dL (ref 8.9–10.3)
Chloride: 106 mmol/L (ref 98–111)
Creatinine, Ser: 0.9 mg/dL (ref 0.44–1.00)
GFR calc Af Amer: >60 mL/min (ref >60)
GFR calc non Af Amer: >60 mL/min (ref >60)
Glucose, Bld: 84 mg/dL (ref 70–99)
Potassium: 4.1 mmol/L (ref 3.5–5.1)
Sodium: 138 mmol/L (ref 135–145)

## 2018-11-11 LAB — CBC
HCT: 40 % (ref 36.0–46.0)
Hemoglobin: 12.7 g/dL (ref 12.0–15.0)
MCH: 28.5 pg (ref 26.0–34.0)
MCHC: 31.8 g/dL (ref 30.0–36.0)
MCV: 89.7 fL (ref 80.0–100.0)
Platelets: 257 10*3/uL (ref 150–400)
RBC: 4.46 MIL/uL (ref 3.87–5.11)
RDW: 13.7 % (ref 11.5–15.5)
WBC: 13 10*3/uL — ABNORMAL HIGH (ref 4.0–10.5)
nRBC: 0 % (ref 0.0–0.2)

## 2018-11-11 LAB — POCT PREGNANCY, URINE: Preg Test, Ur: NEGATIVE

## 2018-11-11 MED ORDER — SODIUM CHLORIDE 0.9 % IV SOLN
1.0000 g | Freq: Once | INTRAVENOUS | Status: AC
  Administered 2018-11-11: 1 g via INTRAVENOUS
  Filled 2018-11-11: qty 10

## 2018-11-11 MED ORDER — ONDANSETRON HCL 4 MG PO TABS: 4.0000 mg | ORAL_TABLET | Freq: Three times a day (TID) | ORAL | 0 refills | Status: AC | PRN

## 2018-11-11 MED ORDER — FENTANYL CITRATE (PF) 100 MCG/2ML IJ SOLN
50.0000 ug | Freq: Once | INTRAMUSCULAR | Status: AC
  Administered 2018-11-11: 50 ug via INTRAVENOUS
  Filled 2018-11-11: qty 2

## 2018-11-11 MED ORDER — KETOROLAC TROMETHAMINE 30 MG/ML IJ SOLN
15.0000 mg | Freq: Once | INTRAMUSCULAR | Status: AC
  Administered 2018-11-11: 17:00:00 15 mg via INTRAVENOUS
  Filled 2018-11-11: qty 1

## 2018-11-11 MED ORDER — NAPROXEN 375 MG PO TBEC: 375.0000 mg | DELAYED_RELEASE_TABLET | Freq: Two times a day (BID) | ORAL | 0 refills | Status: AC

## 2018-11-11 MED ORDER — TRAMADOL HCL 50 MG PO TABS: 50.0000 mg | ORAL_TABLET | Freq: Four times a day (QID) | ORAL | 0 refills | Status: AC | PRN

## 2018-11-11 MED ORDER — CEPHALEXIN 500 MG PO CAPS: 500.0000 mg | ORAL_CAPSULE | Freq: Three times a day (TID) | ORAL | 0 refills | Status: AC

## 2018-11-11 NOTE — ED Provider Notes (Signed)
[facility] Emergency Department Provider Note  ____________________________________________   I have reviewed the triage vital signs and the nursing notes. Where available I have reviewed prior notes and, if possible and indicated, outside hospital notes.    HISTORY  Chief Complaint Flank Pain (right)  Patient seen and evaluated during the coronavirus epidemic during a time with low staffing  HPI Patricia Washington is a 33 y.o. female who presents today complaining of right flank pain dysuria for 2 days.  No fever no vomiting.  Pain is sharp.  Nonradiating.  Never had this before.  No other medical problems.  No allergic to anything.  Has not tried to take anything for it.  Is a crampy discomfort with no radiation.  She has had no vaginal discharge.  She denies diarrhea.  No other illness at this time.  No other alleviating or aggravating factors.  No other associated symptoms.  Burning with her urination got worse today and that is why she came in.      History reviewed. No pertinent past medical history.  There are no active problems to display for this patient.   Past Surgical History:  Procedure Laterality Date  . CESAREAN SECTION    . CHOLECYSTECTOMY      Prior to Admission medications   Medication Sig Start Date End Date Taking? Authorizing Provider  albuterol (PROVENTIL HFA;VENTOLIN HFA) 108 (90 Base) MCG/ACT inhaler Inhale 2 puffs into the lungs every 6 (six) hours as needed for wheezing or shortness of breath. 07/19/18   Duanne Guess, PA-C  benzonatate (TESSALON PERLES) 100 MG capsule Take 1 capsule (100 mg total) by mouth 3 (three) times daily as needed for cough. 08/08/18   Hinda Kehr, MD  carisoprodol (SOMA) 350 MG tablet Take 1 tablet (350 mg total) by mouth 3 (three) times daily as needed. 04/06/16   Orbie Pyo, MD  chlorpheniramine-HYDROcodone (TUSSIONEX PENNKINETIC ER) 10-8 MG/5ML SUER Take 5 mLs by mouth at bedtime as  needed for cough. 07/19/18   Duanne Guess, PA-C  dicyclomine (BENTYL) 20 MG tablet Take 1 tablet (20 mg total) by mouth 3 (three) times daily as needed for spasms. 11/04/15   Daymon Larsen, MD  famotidine (PEPCID) 40 MG tablet Take 1 tablet (40 mg total) by mouth every evening. 04/06/16 04/06/17  Orbie Pyo, MD  fluticasone (FLONASE) 50 MCG/ACT nasal spray Place 1 spray into both nostrils daily. 07/19/18 07/19/19  Duanne Guess, PA-C  HYDROcodone-homatropine (HYCODAN) 5-1.5 MG/5ML syrup Take 5 mLs by mouth every 6 (six) hours as needed for cough. 08/08/18   Hinda Kehr, MD  ibuprofen (ADVIL,MOTRIN) 600 MG tablet Take 1 tablet (600 mg total) by mouth every 6 (six) hours as needed for moderate pain. 07/19/18   Duanne Guess, PA-C  naproxen sodium (ANAPROX) 220 MG tablet Take 440 mg by mouth 2 (two) times daily with a meal.    [provider]  ondansetron (ZOFRAN ODT) 4 MG disintegrating tablet Allow 1-2 tablets to dissolve in your mouth every 8 hours as needed for nausea/vomiting 08/08/18   Hinda Kehr, MD  ondansetron (ZOFRAN) 4 MG tablet Take 1 tablet (4 mg total) by mouth every 8 (eight) hours as needed for nausea or vomiting. Patient not taking: Reported on 04/06/2016 11/04/15   Daymon Larsen, MD  predniSONE (DELTASONE) 20 MG tablet Take 2 tablets (40 mg total) by mouth daily. 07/19/18   Duanne Guess, PA-C  sulfamethoxazole-trimethoprim (BACTRIM DS) 800-160 MG tablet Take  1 tablet by mouth 2 (two) times daily. Patient not taking: Reported on 04/06/2016 06/11/15   Emily FilbertWilliams, Jonathan E, MD    Allergies Patient has no known allergies.  History reviewed. No pertinent family history.  Social History Social History   Tobacco Use  . Smoking status: Current Every Day Smoker    Types: E-cigarettes  . Smokeless tobacco: Never Used  Substance Use Topics  . Alcohol use: Yes    Comment: occ  . Drug use: No    Review of Systems Constitutional: No  fever/chills Eyes: No visual changes. ENT: No sore throat. No stiff neck no neck pain Cardiovascular: Denies chest pain. Respiratory: Denies shortness of breath. Gastrointestinal:   no vomiting.  No diarrhea.  No constipation. Genitourinary: + for dysuria. Musculoskeletal: Negative lower extremity swelling Skin: Negative for rash. Neurological: Negative for severe headaches, focal weakness or numbness.   ____________________________________________   PHYSICAL EXAM:  VITAL SIGNS: ED Triage Vitals  Enc Vitals Group     BP 11/11/18 1534 131/77     Pulse Rate 11/11/18 1534 88     Resp 11/11/18 1534 16     Temp 11/11/18 1534 99 F (37.2 C)     Temp Source 11/11/18 1534 Oral     SpO2 11/11/18 1534 100 %     Weight 11/11/18 1535 183 lb (83 kg)     Height 11/11/18 1535 4\' 10"  (1.473 m)     Head Circumference --      Peak Flow --      Pain Score 11/11/18 1535 9     Pain Loc --      Pain Edu? --      Excl. in GC? --     Constitutional: Alert and oriented. Well appearing and in no acute distress. Eyes: Conjunctivae are normal Head: Atraumatic HEENT: No congestion/rhinnorhea. Mucous membranes are moist.  Oropharynx non-erythematous Neck:   Nontender with no meningismus, no masses, no stridor Cardiovascular: Normal rate, regular rhythm. Grossly normal heart sounds.  Good peripheral circulation. Respiratory: Normal respiratory effort.  No retractions. Lungs CTAB. Abdominal: Soft and nontender. No distention. No guarding no rebound Back:  There is no focal tenderness or step off.  there is no midline tenderness there are no lesions noted. there is positive right CVA tenderness Musculoskeletal: No lower extremity tenderness, no upper extremity tenderness. No joint effusions, no DVT signs strong distal pulses no edema Neurologic:  Normal speech and language. No gross focal neurologic deficits are appreciated.  Skin:  Skin is warm, dry and intact. No rash noted. Psychiatric: Mood and  affect are normal. Speech and behavior are normal.  ____________________________________________   LABS (all labs ordered are listed, but only abnormal results are displayed)  Labs Reviewed  URINALYSIS, COMPLETE (UACMP) WITH MICROSCOPIC - Abnormal; Notable for the following components:      Result Value   Color, Urine YELLOW (*)    APPearance CLOUDY (*)    Specific Gravity, Urine 1.031 (*)    Hgb urine dipstick MODERATE (*)    Protein, ur 100 (*)    Nitrite POSITIVE (*)    Leukocytes,Ua LARGE (*)    RBC / HPF >50 (*)    WBC, UA >50 (*)    Bacteria, UA MANY (*)    All other components within normal limits  CBC - Abnormal; Notable for the following components:   WBC 13.0 (*)    All other components within normal limits  URINE CULTURE  BASIC METABOLIC PANEL  POC URINE  PREG, ED  POCT PREGNANCY, URINE    Pertinent labs  results that were available during my care of the patient were reviewed by me and considered in my medical decision making (see chart for details). ____________________________________________  EKG  I personally interpreted any EKGs ordered by me or triage  ____________________________________________  RADIOLOGY  Pertinent labs & imaging results that were available during my care of the patient were reviewed by me and considered in my medical decision making (see chart for details). If possible, patient and/or family made aware of any abnormal findings.  No results found. ____________________________________________    PROCEDURES  Procedure(s) performed: None  Procedures  Critical Care performed: None  ____________________________________________   INITIAL IMPRESSION / ASSESSMENT AND PLAN / ED COURSE  Pertinent labs & imaging results that were available during my care of the patient were reviewed by me and considered in my medical decision making (see chart for details).  Patient here with evidence of pyelonephritis with dysuria, urinary  frequency, and right flank pain, white count is mildly elevated urine is clearly infected sending a urine culture, nothing to suggest TOA PID appendicitis, diverticulitis, or other intra-abdominal pathology at this time.  No personal family history of kidney stones.  Gradual onset of pain associate with dysuria.  Low suspicion for infected stone.  Give her a dose of Rocephin and pain medication here, she has no allergies, will send her home on antibiotics, with urine culture pending.  Patient declines work note.  She looks quite well.  Extensive return precautions follow-up given and understood.    ____________________________________________   FINAL CLINICAL IMPRESSION(S) / ED DIAGNOSES  Final diagnoses:  None      This chart was dictated using voice recognition software.  Despite best efforts to proofread,  errors can occur which can change meaning.     Stayce Delancy, JamJeanmarie Plantes A, MD 11/11/18 [phone]

## 2018-11-11 NOTE — Discharge Instructions (Signed)
Return to the emergency room for increased pain, fever, vomiting or if you feel worse in any way.  Follow close with primary care doctor.  We are sending a urine culture, if we need to change the antibiotics we will let you know.

## 2018-11-11 NOTE — ED Triage Notes (Signed)
Patient c/o right flank pain with frequent urination.

## 2018-11-13 LAB — URINE CULTURE: Culture: >=100000 — AB

## 2018-11-16 ENCOUNTER — Other Ambulatory Visit: Payer: Self-pay

## 2018-11-16 ENCOUNTER — Encounter: Payer: Self-pay | Admitting: Emergency Medicine

## 2018-11-16 DIAGNOSIS — Z79899 Other long term (current) drug therapy: Secondary | ICD-10-CM | POA: Insufficient documentation

## 2018-11-16 DIAGNOSIS — N12 Tubulo-interstitial nephritis, not specified as acute or chronic: Secondary | ICD-10-CM | POA: Insufficient documentation

## 2018-11-16 DIAGNOSIS — F17210 Nicotine dependence, cigarettes, uncomplicated: Secondary | ICD-10-CM | POA: Insufficient documentation

## 2018-11-16 LAB — CBC
HCT: 39.6 % (ref 36.0–46.0)
Hemoglobin: 12.7 g/dL (ref 12.0–15.0)
MCH: 28.5 pg (ref 26.0–34.0)
MCHC: 32.1 g/dL (ref 30.0–36.0)
MCV: 89 fL (ref 80.0–100.0)
Platelets: 276 10*3/uL (ref 150–400)
RBC: 4.45 MIL/uL (ref 3.87–5.11)
RDW: 13.5 % (ref 11.5–15.5)
WBC: 12.7 10*3/uL — ABNORMAL HIGH (ref 4.0–10.5)
nRBC: 0 % (ref 0.0–0.2)

## 2018-11-16 LAB — URINALYSIS, COMPLETE (UACMP) WITH MICROSCOPIC
Bacteria, UA: NONE SEEN
Bilirubin Urine: NEGATIVE
Glucose, UA: NEGATIVE mg/dL
Hgb urine dipstick: NEGATIVE
Ketones, ur: NEGATIVE mg/dL
Nitrite: NEGATIVE
Protein, ur: NEGATIVE mg/dL
Specific Gravity, Urine: 1.02 (ref 1.005–1.030)
pH: 6 (ref 5.0–8.0)

## 2018-11-16 LAB — COMPREHENSIVE METABOLIC PANEL
ALT: 15 U/L (ref 0–44)
AST: 13 U/L — ABNORMAL LOW (ref 15–41)
Albumin: 4.1 g/dL (ref 3.5–5.0)
Alkaline Phosphatase: 59 U/L (ref 38–126)
Anion gap: 6 (ref 5–15)
BUN: 18 mg/dL (ref 6–20)
CO2: 25 mmol/L (ref 22–32)
Calcium: 9.2 mg/dL (ref 8.9–10.3)
Chloride: 107 mmol/L (ref 98–111)
Creatinine, Ser: 0.74 mg/dL (ref 0.44–1.00)
GFR calc Af Amer: >60 mL/min (ref >60)
GFR calc non Af Amer: >60 mL/min (ref >60)
Glucose, Bld: 99 mg/dL (ref 70–99)
Potassium: 4.2 mmol/L (ref 3.5–5.1)
Sodium: 138 mmol/L (ref 135–145)
Total Bilirubin: 0.5 mg/dL (ref 0.3–1.2)
Total Protein: 7.2 g/dL (ref 6.5–8.1)

## 2018-11-16 NOTE — ED Triage Notes (Signed)
Patient to ER for c/o pain to RLQ abd into right lower back/flank area. Patient states she was seen here on Saturday and given rx for antibiotic for UTI. States she has been taking antibiotic since, but feels pain has worsened and is now seeing pink coloring to urine.

## 2018-11-17 ENCOUNTER — Emergency Department
Admission: EM | Admit: 2018-11-17 | Discharge: 2018-11-17 | Disposition: A | Discharge status: Home / Self Care | Payer: Self-pay | Attending: Emergency Medicine | Admitting: Emergency Medicine

## 2018-11-17 ENCOUNTER — Emergency Department: Payer: Self-pay

## 2018-11-17 DIAGNOSIS — N12 Tubulo-interstitial nephritis, not specified as acute or chronic: Secondary | ICD-10-CM

## 2018-11-17 MED ORDER — PHENAZOPYRIDINE HCL 100 MG PO TABS: 100.0000 mg | ORAL_TABLET | Freq: Three times a day (TID) | ORAL | 0 refills | Status: AC | PRN

## 2018-11-17 MED ORDER — KETOROLAC TROMETHAMINE 30 MG/ML IJ SOLN
INTRAMUSCULAR | Status: AC
  Administered 2018-11-17: 30 mg via INTRAVENOUS
  Filled 2018-11-17: qty 1

## 2018-11-17 MED ORDER — KETOROLAC TROMETHAMINE 30 MG/ML IJ SOLN
30.0000 mg | Freq: Once | INTRAMUSCULAR | Status: AC
  Administered 2018-11-17: 30 mg via INTRAVENOUS

## 2018-11-17 MED ORDER — SODIUM CHLORIDE 0.9 % IV BOLUS
1000.0000 mL | Freq: Once | INTRAVENOUS | Status: AC
  Administered 2018-11-17: 1000 mL via INTRAVENOUS

## 2018-11-17 MED ORDER — OXYCODONE-ACETAMINOPHEN 5-325 MG PO TABS: 1.0000 | ORAL_TABLET | Freq: Four times a day (QID) | ORAL | 0 refills | Status: AC | PRN

## 2018-11-17 NOTE — ED Provider Notes (Signed)
[facility] Emergency Department Provider Note _______   First MD Initiated Contact with Patient 11/17/18 0121     (approximate)  I have reviewed the triage vital signs and the nursing notes.   HISTORY  Chief Complaint Abdominal Pain   HPI Patricia Washington is a 33 y.o. female with recently diagnosed urinary tract infection on 11/11/2018 returns to the emergency department secondary to right flank pain and dysuria.  Patient denies any fever no vomiting.  Patient denies any diarrhea constipation.  Patient denies any vaginal discharge.        Past medical history Recently diagnosed urinary tract infection There are no active problems to display for this patient.   Past Surgical History:  Procedure Laterality Date   CESAREAN SECTION     CHOLECYSTECTOMY      Prior to Admission medications   Medication Sig Start Date End Date Taking? Authorizing Provider  albuterol (PROVENTIL HFA;VENTOLIN HFA) 108 (90 Base) MCG/ACT inhaler Inhale 2 puffs into the lungs every 6 (six) hours as needed for wheezing or shortness of breath. 07/19/18   Evon SlackGaines, Thomas C, PA-C  benzonatate (TESSALON PERLES) 100 MG capsule Take 1 capsule (100 mg total) by mouth 3 (three) times daily as needed for cough. 08/08/18   Loleta RoseForbach, Cory, MD  carisoprodol (SOMA) 350 MG tablet Take 1 tablet (350 mg total) by mouth 3 (three) times daily as needed. 04/06/16   Schaevitz, Myra Rudeavid Matthew, MD  cephALEXin (KEFLEX) 500 MG capsule Take 1 capsule (500 mg total) by mouth 3 (three) times daily for 10 days. 11/11/18 11/21/18  Jeanmarie PlantMcShane, James A, MD  chlorpheniramine-HYDROcodone (TUSSIONEX PENNKINETIC ER) 10-8 MG/5ML SUER Take 5 mLs by mouth at bedtime as needed for cough. 07/19/18   Evon SlackGaines, Thomas C, PA-C  dicyclomine (BENTYL) 20 MG tablet Take 1 tablet (20 mg total) by mouth 3 (three) times daily as needed for spasms. 11/04/15   Jennye MoccasinQuigley, Brian S, MD  famotidine (PEPCID) 40 MG tablet Take 1 tablet (40 mg total) by  mouth every evening. 04/06/16 04/06/17  Myrna BlazerSchaevitz, David Matthew, MD  fluticasone (FLONASE) 50 MCG/ACT nasal spray Place 1 spray into both nostrils daily. 07/19/18 07/19/19  Evon SlackGaines, Thomas C, PA-C  HYDROcodone-homatropine (HYCODAN) 5-1.5 MG/5ML syrup Take 5 mLs by mouth every 6 (six) hours as needed for cough. 08/08/18   Loleta RoseForbach, Cory, MD  ibuprofen (ADVIL,MOTRIN) 600 MG tablet Take 1 tablet (600 mg total) by mouth every 6 (six) hours as needed for moderate pain. 07/19/18   Evon SlackGaines, Thomas C, PA-C  naproxen sodium (ANAPROX) 220 MG tablet Take 440 mg by mouth 2 (two) times daily with a meal.    [provider]  ondansetron (ZOFRAN ODT) 4 MG disintegrating tablet Allow 1-2 tablets to dissolve in your mouth every 8 hours as needed for nausea/vomiting 08/08/18   Loleta RoseForbach, Cory, MD  ondansetron (ZOFRAN) 4 MG tablet Take 1 tablet (4 mg total) by mouth every 8 (eight) hours as needed for nausea or vomiting. Patient not taking: Reported on 04/06/2016 11/04/15   Jennye MoccasinQuigley, Brian S, MD  ondansetron [facility](ZOFRAN) 4 MG tablet Take 1 tablet (4 mg total) by mouth every 8 (eight) hours as needed for nausea or vomiting. 11/11/18   Jeanmarie PlantMcShane, James A, MD  predniSONE (DELTASONE) 20 MG tablet Take 2 tablets (40 mg total) by mouth daily. 07/19/18   Evon SlackGaines, Thomas C, PA-C  sulfamethoxazole-trimethoprim (BACTRIM DS) 800-160 MG tablet Take 1 tablet by mouth 2 (two) times daily. Patient not taking: Reported on 04/06/2016 06/11/15  Earleen Newport, MD    Allergies Patient has no known allergies.  No family history on file.  Social History Social History   Tobacco Use   Smoking status: Current Every Day Smoker    Types: E-cigarettes   Smokeless tobacco: Never Used  Substance Use Topics   Alcohol use: Yes    Comment: occ   Drug use: No    Review of Systems Constitutional: No fever/chills Eyes: No visual changes. ENT: No sore throat. Cardiovascular: Denies chest pain. Respiratory: Denies shortness of  breath. Gastrointestinal: No abdominal pain.  No nausea, no vomiting.  No diarrhea.  No constipation. Genitourinary: Positive for dysuria and hematuria Musculoskeletal: Negative for neck pain.  Negative for back pain. Integumentary: Negative for rash. Neurological: Negative for headaches, focal weakness or numbness.   ____________________________________________   PHYSICAL EXAM:  VITAL SIGNS: ED Triage Vitals  Enc Vitals Group     BP 11/16/18 2219 117/61     Pulse Rate 11/16/18 2219 70     Resp 11/16/18 2219 18     Temp 11/16/18 2219 98.4 F (36.9 C)     Temp Source 11/16/18 2219 Oral     SpO2 11/16/18 2219 100 %     Weight 11/16/18 2220 83 kg (182 lb 15.7 oz)     Height 11/16/18 2220 1.473 m (4\' 10" )     Head Circumference --      Peak Flow --      Pain Score 11/16/18 2220 8     Pain Loc --      Pain Edu? --      Excl. in [place]? --     Constitutional: Alert and oriented. Well appearing and in no acute distress. Eyes: Conjunctivae are normal.  Mouth/Throat: Mucous membranes are moist.  Oropharynx non-erythematous. Neck: No stridor.   Cardiovascular: Normal rate, regular rhythm. Good peripheral circulation. Grossly normal heart sounds. Respiratory: Normal respiratory effort.  No retractions. No audible wheezing. Gastrointestinal: Soft and nontender. No distention.  Right CVA tenderness Musculoskeletal: No lower extremity tenderness nor edema. No gross deformities of extremities. Neurologic:  Normal speech and language. No gross focal neurologic deficits are appreciated.  Skin:  Skin is warm, dry and intact. No rash noted. Psychiatric: Mood and affect are normal. Speech and behavior are normal.  ____________________________________________   LABS (all labs ordered are listed, but only abnormal results are displayed)  Labs Reviewed  COMPREHENSIVE METABOLIC PANEL - Abnormal; Notable for the following components:      Result Value   AST 13 (*)    All other components  within normal limits  CBC - Abnormal; Notable for the following components:   WBC 12.7 (*)    All other components within normal limits  URINALYSIS, COMPLETE (UACMP) WITH MICROSCOPIC - Abnormal; Notable for the following components:   Color, Urine YELLOW (*)    APPearance HAZY (*)    Leukocytes,Ua LARGE (*)    All other components within normal limits   _________________________________________  RADIOLOGY I, [place] Gavan Nordby, personally viewed and evaluated these images (plain radiographs) as part of my medical decision making, as well as reviewing the written report by the radiologist.  ED MD interpretation: No CT evidence of acute intra-abdominal or pelvic pathology.  Official radiology report(s): Ct Renal Stone Study  Result Date: 11/17/2018 CLINICAL DATA:  Flank pain EXAM: CT ABDOMEN AND PELVIS WITHOUT CONTRAST TECHNIQUE: Multidetector CT imaging of the abdomen and pelvis was performed following the standard protocol without IV contrast. COMPARISON:  CT  11/04/2015 FINDINGS: Lower chest: No acute abnormality. Hepatobiliary: No focal liver abnormality is seen. Status post cholecystectomy. No biliary dilatation. Pancreas: Unremarkable. No pancreatic ductal dilatation or surrounding inflammatory changes. Spleen: Normal in size without focal abnormality. Adrenals/Urinary Tract: Adrenal glands are unremarkable. Kidneys are normal, without renal calculi, focal lesion, or hydronephrosis. Bladder is unremarkable. Stomach/Bowel: Stomach is within normal limits. Appendix appears normal. No evidence of bowel wall thickening, distention, or inflammatory changes. Vascular/Lymphatic: No significant vascular findings are present. No enlarged abdominal or pelvic lymph nodes. Reproductive: Intrauterine device in the uterus.  No adnexal mass. Other: No abdominal wall hernia or abnormality. No abdominopelvic ascites. Musculoskeletal: No acute or significant osseous findings. IMPRESSION: 1. Negative. No CT  evidence for acute intra-abdominal or pelvic abnormality. 2. Negative for hydronephrosis or ureteral stone. Negative for acute appendicitis Electronically Signed   By: Jasmine PangKim  Fujinaga M.D.   On: 11/17/2018 02:01     Procedures   ____________________________________________   INITIAL IMPRESSION / MDM / ASSESSMENT AND PLAN / ED COURSE  As part of my medical decision making, I reviewed the following data within the electronic MEDICAL RECORD NUMBER  33 year old female with recent diagnosis of urinary tract infection with urine cultures revealing E. coli and pansensitive to antibiotic therapy that was prescribed Keflex presenting with above-stated history and physical exam.  Suspect known history of UTI however was concerned about the possibility of superimposed kidney stone and pyelonephritis.  Patient's urinalysis today much improved comparison to urinalysis that was obtained on 11/11/2018.  CT scan revealed no acute intra-abdominal or pelvic pathology.  Patient given 30 mg IV Toradol pain improvement.  Patient be prescribed Pyridium and Percocet for home.   *Patricia Washington was evaluated in Emergency Department on 11/17/2018 for the symptoms described in the history of present illness. She was evaluated in the context of the global COVID-19 pandemic, which necessitated consideration that the patient might be at risk for infection with the SARS-CoV-2 virus that causes COVID-19. Institutional protocols and algorithms that pertain to the evaluation of patients at risk for COVID-19 are in a state of rapid change based on information released by regulatory bodies including the CDC and federal and state organizations. These policies and algorithms were followed during the patient's care in the ED.  Some ED evaluations and interventions may be delayed as a result of limited staffing during the pandemic.*    ____________________________________________  FINAL CLINICAL IMPRESSION(S) / ED DIAGNOSES  Final  diagnoses:  Pyelonephritis     MEDICATIONS GIVEN DURING THIS VISIT:  Medications  ketorolac (TORADOL) 30 MG/ML injection 30 mg (30 mg Intravenous Given 11/17/18 0137)  sodium chloride 0.9 % bolus 1,000 mL (1,000 mLs Intravenous New Bag/Given 11/17/18 0136)     ED Discharge Orders    None       Note:  This document was prepared using Dragon voice recognition software and may include unintentional dictation errors.   Darci CurrentBrown,  N, MD 11/17/18 [phone]

## 2019-03-15 ENCOUNTER — Other Ambulatory Visit: Payer: Self-pay

## 2019-03-15 DIAGNOSIS — Z20822 Contact with and (suspected) exposure to covid-19: Secondary | ICD-10-CM

## 2019-03-17 LAB — NOVEL CORONAVIRUS, NAA: SARS-CoV-2, NAA: NOT DETECTED

## 2019-06-29 ENCOUNTER — Ambulatory Visit
Admission: EM | Admit: 2019-06-29 | Discharge: 2019-06-29 | Disposition: A | Discharge status: Home / Self Care | Payer: Self-pay

## 2019-06-29 ENCOUNTER — Ambulatory Visit: Payer: Self-pay | Attending: Internal Medicine

## 2019-06-29 DIAGNOSIS — Z20822 Contact with and (suspected) exposure to covid-19: Secondary | ICD-10-CM | POA: Insufficient documentation

## 2019-06-30 LAB — NOVEL CORONAVIRUS, NAA: SARS-CoV-2, NAA: NOT DETECTED

## 2020-01-27 ENCOUNTER — Other Ambulatory Visit: Payer: Self-pay

## 2020-01-27 ENCOUNTER — Emergency Department
Admission: EM | Admit: 2020-01-27 | Discharge: 2020-01-27 | Disposition: A | Discharge status: Home / Self Care | Payer: Self-pay | Attending: Emergency Medicine | Admitting: Emergency Medicine

## 2020-01-27 ENCOUNTER — Emergency Department: Payer: Self-pay

## 2020-01-27 ENCOUNTER — Encounter: Payer: Self-pay | Admitting: Emergency Medicine

## 2020-01-27 DIAGNOSIS — R0789 Other chest pain: Secondary | ICD-10-CM | POA: Insufficient documentation

## 2020-01-27 DIAGNOSIS — Z5321 Procedure and treatment not carried out due to patient leaving prior to being seen by health care provider: Secondary | ICD-10-CM | POA: Insufficient documentation

## 2020-01-27 LAB — BASIC METABOLIC PANEL
Anion gap: 8 (ref 5–15)
BUN: 12 mg/dL (ref 6–20)
CO2: 23 mmol/L (ref 22–32)
Calcium: 8.6 mg/dL — ABNORMAL LOW (ref 8.9–10.3)
Chloride: 108 mmol/L (ref 98–111)
Creatinine, Ser: 0.68 mg/dL (ref 0.44–1.00)
GFR calc Af Amer: >60 mL/min (ref >60)
GFR calc non Af Amer: >60 mL/min (ref >60)
Glucose, Bld: 93 mg/dL (ref 70–99)
Potassium: 4.2 mmol/L (ref 3.5–5.1)
Sodium: 139 mmol/L (ref 135–145)

## 2020-01-27 LAB — CBC
HCT: 35.6 % — ABNORMAL LOW (ref 36.0–46.0)
Hemoglobin: 12.3 g/dL (ref 12.0–15.0)
MCH: 28.9 pg (ref 26.0–34.0)
MCHC: 34.6 g/dL (ref 30.0–36.0)
MCV: 83.8 fL (ref 80.0–100.0)
Platelets: 228 10*3/uL (ref 150–400)
RBC: 4.25 MIL/uL (ref 3.87–5.11)
RDW: 13.7 % (ref 11.5–15.5)
WBC: 8.9 10*3/uL (ref 4.0–10.5)
nRBC: 0 % (ref 0.0–0.2)

## 2020-01-27 LAB — TROPONIN I (HIGH SENSITIVITY): Troponin I (High Sensitivity): <2 ng/L (ref <18)

## 2020-01-27 NOTE — ED Notes (Signed)
No answer

## 2020-01-27 NOTE — ED Notes (Signed)
Called to check on labs, report should result shortly.

## 2020-01-27 NOTE — ED Triage Notes (Signed)
First nurse note- here for CP.  Ambulatory, NAD

## 2020-01-27 NOTE — ED Triage Notes (Signed)
Pt here for mid chest tightness.  Unlabored. Denies travel or birth control.  Ambulatory without distress. No fever or cough.

## 2020-01-27 NOTE — ED Notes (Signed)
No answer, cannot find pt for room.

## 2020-01-27 NOTE — ED Notes (Signed)
Called no answer, for room

## 2021-02-23 ENCOUNTER — Other Ambulatory Visit: Payer: Self-pay

## 2021-02-23 ENCOUNTER — Emergency Department: Payer: Self-pay

## 2021-02-23 ENCOUNTER — Emergency Department
Admission: EM | Admit: 2021-02-23 | Discharge: 2021-02-23 | Disposition: A | Discharge status: Home / Self Care | Payer: Self-pay | Attending: Emergency Medicine | Admitting: Emergency Medicine

## 2021-02-23 DIAGNOSIS — R202 Paresthesia of skin: Secondary | ICD-10-CM | POA: Insufficient documentation

## 2021-02-23 DIAGNOSIS — F17290 Nicotine dependence, other tobacco product, uncomplicated: Secondary | ICD-10-CM | POA: Insufficient documentation

## 2021-02-23 DIAGNOSIS — R200 Anesthesia of skin: Secondary | ICD-10-CM | POA: Insufficient documentation

## 2021-02-23 DIAGNOSIS — B349 Viral infection, unspecified: Secondary | ICD-10-CM | POA: Insufficient documentation

## 2021-02-23 DIAGNOSIS — U071 COVID-19: Secondary | ICD-10-CM | POA: Insufficient documentation

## 2021-02-23 LAB — BASIC METABOLIC PANEL
Anion gap: 8 (ref 5–15)
BUN: 6 mg/dL (ref 6–20)
CO2: 20 mmol/L — ABNORMAL LOW (ref 22–32)
Calcium: 8.8 mg/dL — ABNORMAL LOW (ref 8.9–10.3)
Chloride: 107 mmol/L (ref 98–111)
Creatinine, Ser: 0.94 mg/dL (ref 0.44–1.00)
GFR, Estimated: >60 mL/min (ref >60)
Glucose, Bld: 120 mg/dL — ABNORMAL HIGH (ref 70–99)
Potassium: 3.6 mmol/L (ref 3.5–5.1)
Sodium: 135 mmol/L (ref 135–145)

## 2021-02-23 LAB — TROPONIN I (HIGH SENSITIVITY): Troponin I (High Sensitivity): <2 ng/L (ref <18)

## 2021-02-23 LAB — CBC
HCT: 37.2 % (ref 36.0–46.0)
Hemoglobin: 12.5 g/dL (ref 12.0–15.0)
MCH: 29.3 pg (ref 26.0–34.0)
MCHC: 33.6 g/dL (ref 30.0–36.0)
MCV: 87.3 fL (ref 80.0–100.0)
Platelets: 237 10*3/uL (ref 150–400)
RBC: 4.26 MIL/uL (ref 3.87–5.11)
RDW: 13.7 % (ref 11.5–15.5)
WBC: 13.8 10*3/uL — ABNORMAL HIGH (ref 4.0–10.5)
nRBC: 0 % (ref 0.0–0.2)

## 2021-02-23 LAB — RESP PANEL BY RT-PCR (FLU A&B, COVID) ARPGX2
Influenza A by PCR: NEGATIVE
Influenza B by PCR: NEGATIVE
SARS Coronavirus 2 by RT PCR: POSITIVE — AB

## 2021-02-23 MED ORDER — ONDANSETRON 4 MG PO TBDP: 4.0000 mg | ORAL_TABLET | Freq: Three times a day (TID) | ORAL | 0 refills | Status: AC | PRN

## 2021-02-23 MED ORDER — BENZONATATE 100 MG PO CAPS: 100.0000 mg | ORAL_CAPSULE | Freq: Three times a day (TID) | ORAL | 0 refills | Status: AC | PRN

## 2021-02-23 MED ORDER — IBUPROFEN 600 MG PO TABS
600.0000 mg | ORAL_TABLET | Freq: Once | ORAL | Status: AC
  Administered 2021-02-23: 600 mg via ORAL
  Filled 2021-02-23: qty 1

## 2021-02-23 MED ORDER — PSEUDOEPH-BROMPHEN-DM 30-2-10 MG/5ML PO SYRP: 10.0000 mL | ORAL_SOLUTION | Freq: Four times a day (QID) | ORAL | 0 refills | Status: AC | PRN

## 2021-02-23 NOTE — ED Notes (Signed)
Attempt to call pt at 1859 at phone number listed in chart to notify of covid positive status without answer, unable to leave message.

## 2021-02-23 NOTE — ED Triage Notes (Signed)
Pt states she started having CP and R arm pain yesterday- pt has exertional SHOB and states she does have a cough- when asked about pain she stated her "whole body hurts"

## 2021-02-23 NOTE — ED Provider Notes (Signed)
[facility] Emergency Department Provider Note  ____________________________________________  Time seen: Approximately 4:34 PM  I have reviewed the triage vital signs and the nursing notes.   HISTORY  Chief Complaint Chest Pain    HPI Patricia Washington is a 35 y.o. female who presents emergency department for multiple complaints to include right arm numbness and tingling, cough, exertional shortness of breath, fevers, body aches, congestion, cough.  Symptoms began yesterday.  When patient developed tingling in her right arm this morning she presented to the ED for evaluation.  She has been nauseated but no emesis.  No abdominal pain.  No urinary symptoms.  No constipation.  Patient has had diarrhea.  Patient has taken Tylenol and DayQuil prior to arrival.       History reviewed. No pertinent past medical history.  There are no problems to display for this patient.   Past Surgical History:  Procedure Laterality Date   CESAREAN SECTION     CHOLECYSTECTOMY      Prior to Admission medications   Medication Sig Start Date End Date Taking? Authorizing Provider  benzonatate (TESSALON PERLES) 100 MG capsule Take 1 capsule (100 mg total) by mouth 3 (three) times daily as needed for cough. 02/23/21 02/23/22 Yes Erina Hamme, Delorise Royals, PA-C  brompheniramine-pseudoephedrine-DM 30-2-10 MG/5ML syrup Take 10 mLs by mouth 4 (four) times daily as needed. 02/23/21  Yes Shayda Kalka, Delorise Royals, PA-C  ondansetron (ZOFRAN-ODT) 4 MG disintegrating tablet Take 1 tablet (4 mg total) by mouth every 8 (eight) hours as needed for nausea or vomiting. 02/23/21  Yes Kadance Mccuistion, Delorise Royals, PA-C  albuterol (PROVENTIL HFA;VENTOLIN HFA) 108 (90 Base) MCG/ACT inhaler Inhale 2 puffs into the lungs every 6 (six) hours as needed for wheezing or shortness of breath. 07/19/18   Evon Slack, PA-C  carisoprodol (SOMA) 350 MG tablet Take 1 tablet (350 mg total) by mouth 3 (three) times daily as needed.  04/06/16   Myrna Blazer, MD  chlorpheniramine-HYDROcodone (TUSSIONEX PENNKINETIC ER) 10-8 MG/5ML SUER Take 5 mLs by mouth at bedtime as needed for cough. 07/19/18   Evon Slack, PA-C  dicyclomine (BENTYL) 20 MG tablet Take 1 tablet (20 mg total) by mouth 3 (three) times daily as needed for spasms. 11/04/15   Jennye Moccasin, MD  famotidine (PEPCID) 40 MG tablet Take 1 tablet (40 mg total) by mouth every evening. 04/06/16 04/06/17  Myrna Blazer, MD  fluticasone (FLONASE) 50 MCG/ACT nasal spray Place 1 spray into both nostrils daily. 07/19/18 07/19/19  Evon Slack, PA-C  HYDROcodone-homatropine (HYCODAN) 5-1.5 MG/5ML syrup Take 5 mLs by mouth every 6 (six) hours as needed for cough. 08/08/18   Loleta Rose, MD  ibuprofen (ADVIL,MOTRIN) 600 MG tablet Take 1 tablet (600 mg total) by mouth every 6 (six) hours as needed for moderate pain. 07/19/18   Evon Slack, PA-C  naproxen sodium (ANAPROX) 220 MG tablet Take 440 mg by mouth 2 (two) times daily with a meal.    [provider]  ondansetron (ZOFRAN) 4 MG tablet Take 1 tablet (4 mg total) by mouth every 8 (eight) hours as needed for nausea or vomiting. Patient not taking: Reported on 04/06/2016 11/04/15   Jennye Moccasin, MD  ondansetron [facility]) 4 MG tablet Take 1 tablet (4 mg total) by mouth every 8 (eight) hours as needed for nausea or vomiting. 11/11/18   Jeanmarie Plant, MD  predniSONE (DELTASONE) 20 MG tablet Take 2 tablets (40 mg total) by mouth daily. 07/19/18  Evon Slack, PA-C  sulfamethoxazole-trimethoprim (BACTRIM DS) 800-160 MG tablet Take 1 tablet by mouth 2 (two) times daily. Patient not taking: Reported on 04/06/2016 06/11/15   Emily Filbert, MD    Allergies Patient has no known allergies.  No family history on file.  Social History Social History   Tobacco Use   Smoking status: Every Day    Types: E-cigarettes   Smokeless tobacco: Never  Substance Use Topics   Alcohol use: Yes     Comment: occ   Drug use: No     Review of Systems  Constitutional: Positive fever/chills.  Positive for body aches Eyes: No visual changes. No discharge ENT: No upper respiratory complaints. Cardiovascular: Positive chest pain. Respiratory: Positive cough. No SOB. Gastrointestinal: No abdominal pain.  No nausea, no vomiting.  No diarrhea.  No constipation. Genitourinary: Negative for dysuria. No hematuria Musculoskeletal: Negative for musculoskeletal pain.  Tingling in the right arm. Skin: Negative for rash, abrasions, lacerations, ecchymosis. Neurological: Negative for headaches, focal weakness or numbness.  10 System ROS otherwise negative.  ____________________________________________   PHYSICAL EXAM:  VITAL SIGNS: ED Triage Vitals  Enc Vitals Group     BP 02/23/21 1537 127/78     Pulse Rate 02/23/21 1537 (!) 110     Resp 02/23/21 1537 18     Temp 02/23/21 1539 100.1 F (37.8 C)     Temp Source 02/23/21 1539 Oral     SpO2 02/23/21 1537 94 %     Weight 02/23/21 1536 210 lb (95.3 kg)     Height 02/23/21 1536 4\' 10"  (1.473 m)     Head Circumference --      Peak Flow --      Pain Score 02/23/21 1536 9     Pain Loc --      Pain Edu? --      Excl. in [place]? --      Constitutional: Alert and oriented. Well appearing and in no acute distress. Eyes: Conjunctivae are normal. PERRL. EOMI. Head: Atraumatic. ENT:      Ears: Cerumen bilaterally.  Cerumen does not appear impacted but cannot visualize TM past significant cerumen.      Nose: No congestion/rhinnorhea.      Mouth/Throat: Mucous membranes are moist.  Neck: No stridor.  Neck is supple full range of motion Hematological/Lymphatic/Immunilogical: No cervical lymphadenopathy. Cardiovascular: Normal rate, regular rhythm. Normal S1 and S2.  Good peripheral circulation. Respiratory: Normal respiratory effort without tachypnea or retractions. Lungs CTAB. Good air entry to the bases with no decreased or absent breath  sounds. Gastrointestinal: Bowel sounds 4 quadrants. Soft and nontender to palpation. No guarding or rigidity. No palpable masses. No distention. No CVA tenderness Musculoskeletal: Full range of motion to all extremities. No gross deformities appreciated. Neurologic:  Normal speech and language. No gross focal neurologic deficits are appreciated.  Skin:  Skin is warm, dry and intact. No rash noted. Psychiatric: Mood and affect are normal. Speech and behavior are normal. Patient exhibits appropriate insight and judgement.   ____________________________________________   LABS (all labs ordered are listed, but only abnormal results are displayed)  Labs Reviewed  BASIC METABOLIC PANEL - Abnormal; Notable for the following components:      Result Value   CO2 20 (*)    Glucose, Bld 120 (*)    Calcium 8.8 (*)    All other components within normal limits  CBC - Abnormal; Notable for the following components:   WBC 13.8 (*)    All  other components within normal limits  RESP PANEL BY RT-PCR (FLU A&B, COVID) ARPGX2  POC URINE PREG, ED  TROPONIN I (HIGH SENSITIVITY)   ____________________________________________  EKG  ED ECG REPORT I, Delorise Royals Elayna Tobler,  personally viewed and interpreted this ECG.   Date: 02/23/2021  EKG Time: 1534 hrs.  Rate: 118 bpm  Rhythm: unchanged from previous tracings, sinus tachycardia, sinus tachycardia today but no other significant changes from previous EKG from 01/30/2020  Axis: Normal axis  Intervals:none  ST&T Change: No ST elevation or depression.  Nonspecific T wave changes in the anterolateral leads  Sinus tachycardia but no STEMI.  Other than tachycardia no significant changes from previous EKG on 01/30/2020  ____________________________________________  RADIOLOGY I personally viewed and evaluated these images as part of my medical decision making, as well as reviewing the written report by the radiologist.  ED Provider Interpretation: No  acute cardiopulmonary findings on x-ray  DG Chest 2 View  Result Date: 02/23/2021 CLINICAL DATA:  Central chest pain. EXAM: CHEST - 2 VIEW COMPARISON:  January 27, 2020 FINDINGS: The heart size and mediastinal contours are within normal limits. Both lungs are clear. The visualized skeletal structures are unremarkable. IMPRESSION: No active cardiopulmonary disease. Electronically Signed   By: Sherian Rein M.D.   On: 02/23/2021 16:03    ____________________________________________    PROCEDURES  Procedure(s) performed:    Procedures    Medications  ibuprofen (ADVIL) tablet 600 mg (has no administration in time range)     ____________________________________________   INITIAL IMPRESSION / ASSESSMENT AND PLAN / ED COURSE  Pertinent labs & imaging results that were available during my care of the patient were reviewed by me and considered in my medical decision making (see chart for details).  Review of the [place] CSRS was performed in accordance of the [facility] prior to dispensing any controlled drugs.           Patient's diagnosis is consistent with viral illness.  Patient presented with multiple URI symptoms but was also complaining of chest pain on arrival to the emergency department.  On assessment, no chest pain is reported.  Patient states that she hurts "all over."  Labs are reassuring.  EKG and chest x-ray are reassuring.  With URI symptoms, nausea, diarrhea I suspect viral illness such as flu or COVID.  Patient will have swabs for testing, will follow results on MyChart.  Symptom control medications prescribed at this time.  Follow-up primary care as needed..  Patient is given ED precautions to return to the ED for any worsening or new symptoms.     ____________________________________________  FINAL CLINICAL IMPRESSION(S) / ED DIAGNOSES  Final diagnoses:  Viral illness      NEW MEDICATIONS STARTED DURING THIS VISIT:  ED Discharge Orders          Ordered     benzonatate (TESSALON PERLES) 100 MG capsule  3 times daily PRN        02/23/21 1649    brompheniramine-pseudoephedrine-DM 30-2-10 MG/5ML syrup  4 times daily PRN        02/23/21 1649    ondansetron (ZOFRAN-ODT) 4 MG disintegrating tablet  Every 8 hours PRN        02/23/21 1649                This chart was dictated using voice recognition software/Dragon. Despite best efforts to proofread, errors can occur which can change the meaning. Any change was purely unintentional.    Marthe Dant, Delorise Royals, PA-C  02/23/21 1649    Gilles Chiquito, MD 02/23/21 1850

## 2021-02-23 NOTE — ED Notes (Signed)
Notified dr. Antoine Primas of pt's positive covid called from lab at 1900

## 2021-07-09 ENCOUNTER — Emergency Department
Admission: EM | Admit: 2021-07-09 | Discharge: 2021-07-09 | Disposition: A | Discharge status: Home / Self Care | Payer: Self-pay | Attending: Emergency Medicine | Admitting: Emergency Medicine

## 2021-07-09 ENCOUNTER — Emergency Department: Payer: Self-pay

## 2021-07-09 ENCOUNTER — Other Ambulatory Visit: Payer: Self-pay

## 2021-07-09 DIAGNOSIS — R1084 Generalized abdominal pain: Secondary | ICD-10-CM | POA: Insufficient documentation

## 2021-07-09 DIAGNOSIS — R112 Nausea with vomiting, unspecified: Secondary | ICD-10-CM | POA: Insufficient documentation

## 2021-07-09 DIAGNOSIS — R197 Diarrhea, unspecified: Secondary | ICD-10-CM | POA: Insufficient documentation

## 2021-07-09 LAB — CBC
HCT: 39.1 % (ref 36.0–46.0)
Hemoglobin: 12.7 g/dL (ref 12.0–15.0)
MCH: 28.5 pg (ref 26.0–34.0)
MCHC: 32.5 g/dL (ref 30.0–36.0)
MCV: 87.7 fL (ref 80.0–100.0)
Platelets: 294 10*3/uL (ref 150–400)
RBC: 4.46 MIL/uL (ref 3.87–5.11)
RDW: 13.2 % (ref 11.5–15.5)
WBC: 10.7 10*3/uL — ABNORMAL HIGH (ref 4.0–10.5)
nRBC: 0 % (ref 0.0–0.2)

## 2021-07-09 LAB — URINALYSIS, ROUTINE W REFLEX MICROSCOPIC
Bacteria, UA: NONE SEEN
Bilirubin Urine: NEGATIVE
Glucose, UA: NEGATIVE mg/dL
Hgb urine dipstick: NEGATIVE
Ketones, ur: NEGATIVE mg/dL
Nitrite: NEGATIVE
Protein, ur: NEGATIVE mg/dL
Specific Gravity, Urine: 1.024 (ref 1.005–1.030)
pH: 6 (ref 5.0–8.0)

## 2021-07-09 LAB — COMPREHENSIVE METABOLIC PANEL
ALT: 13 U/L (ref 0–44)
AST: 12 U/L — ABNORMAL LOW (ref 15–41)
Albumin: 3.8 g/dL (ref 3.5–5.0)
Alkaline Phosphatase: 58 U/L (ref 38–126)
Anion gap: 6 (ref 5–15)
BUN: 11 mg/dL (ref 6–20)
CO2: 24 mmol/L (ref 22–32)
Calcium: 8.9 mg/dL (ref 8.9–10.3)
Chloride: 105 mmol/L (ref 98–111)
Creatinine, Ser: 0.83 mg/dL (ref 0.44–1.00)
GFR, Estimated: >60 mL/min (ref >60)
Glucose, Bld: 95 mg/dL (ref 70–99)
Potassium: 4.1 mmol/L (ref 3.5–5.1)
Sodium: 135 mmol/L (ref 135–145)
Total Bilirubin: 0.4 mg/dL (ref 0.3–1.2)
Total Protein: 7.4 g/dL (ref 6.5–8.1)

## 2021-07-09 LAB — LIPASE, BLOOD: Lipase: 30 U/L (ref 11–51)

## 2021-07-09 LAB — POC URINE PREG, ED: Preg Test, Ur: NEGATIVE

## 2021-07-09 MED ORDER — ONDANSETRON 4 MG PO TBDP: 4.0000 mg | ORAL_TABLET | Freq: Three times a day (TID) | ORAL | 0 refills | Status: AC | PRN

## 2021-07-09 MED ORDER — KETOROLAC TROMETHAMINE 30 MG/ML IJ SOLN
30.0000 mg | Freq: Once | INTRAMUSCULAR | Status: AC
  Administered 2021-07-09: 30 mg via INTRAMUSCULAR
  Filled 2021-07-09: qty 1

## 2021-07-09 MED ORDER — ONDANSETRON 4 MG PO TBDP
4.0000 mg | ORAL_TABLET | Freq: Once | ORAL | Status: AC
  Administered 2021-07-09: 4 mg via ORAL
  Filled 2021-07-09: qty 1

## 2021-07-09 NOTE — Discharge Instructions (Signed)
Please take Tylenol and ibuprofen/Advil for your pain.  It is safe to take them together, or to alternate them every few hours.  Take up to 1000mg of Tylenol at a time, up to 4 times per day.  Do not take more than 4000 mg of Tylenol in 24 hours.  For ibuprofen, take 400-600 mg, 4-5 times per day. ° °You Zofran as needed for nausea and vomiting. °

## 2021-07-09 NOTE — ED Provider Notes (Signed)
[facility] Provider Note    Event Date/Time   First MD Initiated Contact with Patient 07/09/21 1049     (approximate)   History   Abdominal Pain   HPI  Patricia Washington is a 36 y.o. female who presents to the ED for evaluation of Abdominal Pain   Patient presents to the ED with 1 or 2 days of generalized abdominal discomfort superimposed on 3 to 4 days of nausea, vomiting and diarrhea.  She reports her son had a GI illness last week.  She reports developing vomiting and diarrhea without much abdominal pain over the past weekend, about 4 days ago.  Recurrent episodes of nonbloody nonbilious emesis and watery diarrhea.  She reports this continued through the day yesterday, but yesterday afternoon she developed abdominal pain.  She reports cramping and sharp pain, primarily throughout her upper abdomen, but also to a lesser extent diffusely.  Reports Tylenol/ibuprofen with minimal improvement.  Due to the pain, she presents to the ED for evaluation.  Patient reports a surgical history of cholecystectomy and cesarean section.   Physical Exam   Triage Vital Signs: ED Triage Vitals  Enc Vitals Group     BP 07/09/21 0916 137/86     Pulse Rate 07/09/21 0916 71     Resp 07/09/21 0916 17     Temp 07/09/21 0916 98.6 F (37 C)     Temp src --      SpO2 07/09/21 0916 100 %     Weight --      Height --      Head Circumference --      Peak Flow --      Pain Score 07/09/21 0916 9     Pain Loc --      Pain Edu? --      Excl. in GC? --     Most recent vital signs: Vitals:   07/09/21 0916  BP: 137/86  Pulse: 71  Resp: 17  Temp: 98.6 F (37 C)  SpO2: 100%    General: Awake, no distress.  Morbidly obese.  Pleasant and conversational in full sentences. CV:  Good peripheral perfusion.  Resp:  Normal effort.  Abd:  No distention.  Diffuse and mild tenderness.  Does seem more pronounced throughout the upper abdomen without particularly localizing  features. MSK:  No deformity noted.  Neuro:  No focal deficits appreciated. Other:     ED Results / Procedures / Treatments   Labs (all labs ordered are listed, but only abnormal results are displayed) Labs Reviewed  COMPREHENSIVE METABOLIC PANEL - Abnormal; Notable for the following components:      Result Value   AST 12 (*)    All other components within normal limits  CBC - Abnormal; Notable for the following components:   WBC 10.7 (*)    All other components within normal limits  URINALYSIS, ROUTINE W REFLEX MICROSCOPIC - Abnormal; Notable for the following components:   Color, Urine YELLOW (*)    APPearance HAZY (*)    Leukocytes,Ua MODERATE (*)    All other components within normal limits  LIPASE, BLOOD  POC URINE PREG, ED    EKG   RADIOLOGY CT abdomen/pelvis reviewed by me without evidence of acute intra-abdominal pathology  Official radiology report(s): CT ABDOMEN PELVIS WO CONTRAST  Result Date: 07/09/2021 CLINICAL DATA:  Diffuse abdominal pain, nausea, vomiting, diarrhea, question small-bowel obstruction, patient rates pain at 9/10 EXAM: CT ABDOMEN AND PELVIS WITHOUT CONTRAST TECHNIQUE: Multidetector CT  imaging of the abdomen and pelvis was performed following the standard protocol without IV contrast. RADIATION DOSE REDUCTION: This exam was performed according to the departmental dose-optimization program which includes automated exposure control, adjustment of the mA and/or kV according to patient size and/or use of iterative reconstruction technique. COMPARISON:  11/17/2018 FINDINGS: Lower chest: Lung bases clear Hepatobiliary: Gallbladder surgically absent.  Liver unremarkable. Pancreas: Normal appearance Spleen: Normal appearance Adrenals/Urinary Tract: Adrenal glands, kidneys, ureters, and bladder normal appearance Stomach/Bowel: Normal appendix. Stomach and bowel loops grossly normal appearance for technique Vascular/Lymphatic: Aorta normal caliber.  No  adenopathy. Reproductive: IUD within uterus. Uterus and ovaries otherwise normal Other: No free air or free fluid. No hernia or inflammatory process. Musculoskeletal: Osseous structures unremarkable. IMPRESSION: Prior cholecystectomy Otherwise negative exam. Electronically Signed   By: Ulyses Southward M.D.   On: 07/09/2021 12:25    PROCEDURES and INTERVENTIONS:  Procedures  Medications  ondansetron (ZOFRAN-ODT) disintegrating tablet 4 mg (4 mg Oral Given 07/09/21 1157)  ketorolac (TORADOL) 30 MG/ML injection 30 mg (30 mg Intramuscular Given 07/09/21 1158)     IMPRESSION / MDM / ASSESSMENT AND PLAN / ED COURSE  I reviewed the triage vital signs and the nursing notes.  Morbidly obese 36 year old woman presents to the ED with generalized abdominal pain in the setting of a few days of nausea, vomiting and diarrhea.  Normal vitals and reassuring examination.  Has some mild tenderness without peritoneal features.  Marginal leukocytosis is noted.  Otherwise normal metabolic panel, lipase.  Not pregnant and urine without infectious features.  Due to her surgical history and tenderness on examination, CT obtained and without evidence of SBO, enteritis or appendicitis.  She is feeling better after Toradol.  We discussed management at home and return precautions for the ED.  Possibly viral etiology.  Clinical Course as of 07/09/21 1252  Tue Jul 09, 2021  1141 Due to surgical history and tenderness I recommended CT abdomen/pelvis and she is agreeable. [DS]  1250 Reassessed.  Feeling better and tolerating p.o. intake.  We discussed reassuring CT and possible etiologies of her symptoms. [DS]    Clinical Course User Index [DS] Delton Prairie, MD     FINAL CLINICAL IMPRESSION(S) / ED DIAGNOSES   Final diagnoses:  Generalized abdominal pain  Nausea vomiting and diarrhea     Rx / DC Orders   ED Discharge Orders          Ordered    ondansetron (ZOFRAN-ODT) 4 MG disintegrating tablet  Every 8 hours PRN         07/09/21 1252             Note:  This document was prepared using Dragon voice recognition software and may include unintentional dictation errors.   Delton Prairie, MD 07/09/21 [phone]

## 2021-07-09 NOTE — ED Triage Notes (Signed)
Pt comes with c/o N/V/D and bell pain since Saturday. Pt states pain is at 9/10 that is shooting. Pt denies any urinary symptoms.

## 2022-10-06 ENCOUNTER — Emergency Department
Admission: EM | Admit: 2022-10-06 | Discharge: 2022-10-06 | Disposition: A | Discharge status: Home / Self Care | Payer: BC Managed Care – PPO | Attending: Emergency Medicine | Admitting: Emergency Medicine

## 2022-10-06 ENCOUNTER — Other Ambulatory Visit: Payer: Self-pay

## 2022-10-06 DIAGNOSIS — M62830 Muscle spasm of back: Secondary | ICD-10-CM | POA: Diagnosis not present

## 2022-10-06 DIAGNOSIS — M549 Dorsalgia, unspecified: Secondary | ICD-10-CM | POA: Diagnosis present

## 2022-10-06 LAB — POC URINE PREG, ED: Preg Test, Ur: NEGATIVE

## 2022-10-06 MED ORDER — KETOROLAC TROMETHAMINE 15 MG/ML IJ SOLN
15.0000 mg | Freq: Once | INTRAMUSCULAR | Status: AC
  Administered 2022-10-06: 15 mg via INTRAMUSCULAR
  Filled 2022-10-06: qty 1

## 2022-10-06 MED ORDER — LIDOCAINE 5 % EX PTCH
1.0000 | MEDICATED_PATCH | CUTANEOUS | Status: DC
  Filled 2022-10-06: qty 1

## 2022-10-06 MED ORDER — CYCLOBENZAPRINE HCL 5 MG PO TABS: 5.0000 mg | ORAL_TABLET | Freq: Three times a day (TID) | ORAL | 0 refills | Status: AC | PRN

## 2022-10-06 MED ORDER — ACETAMINOPHEN 325 MG PO TABS
650.0000 mg | ORAL_TABLET | Freq: Once | ORAL | Status: AC
  Administered 2022-10-06: 650 mg via ORAL
  Filled 2022-10-06: qty 2

## 2022-10-06 NOTE — ED Provider Notes (Signed)
[facility] Provider Note    Event Date/Time   First MD Initiated Contact with Patient 10/06/22 [phone]     (approximate)   History   Back Pain   HPI  Patricia Washington is a 37 y.o. female who presents today for evaluation of back pain.  Patient reports that this has been ongoing for the past 2 weeks.  She feels that it has been worsened by her sitting all day at home for her job.  She denies any radiation of pain into her legs or into her abdomen.  She denies chest pain or shortness of breath.  She denies urinary symptoms.  No urinary or fecal incontinence or retention.  No saddle anesthesia.  She is still able to ambulate.  She reports that she took her friend's Vicodin.  There are no problems to display for this patient.         Physical Exam   Triage Vital Signs: ED Triage Vitals  Enc Vitals Group     BP 10/06/22 0629 (!) 143/107     Pulse Rate 10/06/22 0629 79     Resp --      Temp 10/06/22 0629 98.4 F (36.9 C)     Temp Source 10/06/22 0629 Oral     SpO2 10/06/22 0629 100 %     Weight 10/06/22 0627 180 lb (81.6 kg)     Height 10/06/22 0627 4\' 10"  (1.473 m)     Head Circumference --      Peak Flow --      Pain Score 10/06/22 0627 8     Pain Loc --      Pain Edu? --      Excl. in GC? --     Most recent vital signs: Vitals:   10/06/22 0629  BP: (!) 143/107  Pulse: 79  Temp: 98.4 F (36.9 C)  SpO2: 100%    Physical Exam Vitals and nursing note reviewed.  Constitutional:      General: Awake and alert. No acute distress.    Appearance: Normal appearance. The patient is normal weight.  HENT:     Head: Normocephalic and atraumatic.     Mouth: Mucous membranes are moist.  Eyes:     General: PERRL. Normal EOMs        Right eye: No discharge.        Left eye: No discharge.     Conjunctiva/sclera: Conjunctivae normal.  Cardiovascular:     Rate and Rhythm: Normal rate and regular rhythm.     Pulses: Normal pulses.  Pulmonary:      Effort: Pulmonary effort is normal. No respiratory distress.     Breath sounds: Normal breath sounds.  Abdominal:     Abdomen is soft. There is no abdominal tenderness.  Musculoskeletal:        General: No swelling. Normal range of motion.     Cervical back: Normal range of motion and neck supple.  Back: No midline tenderness.  Tenderness palpation to lumbar and thoracic paraspinal muscles.  Strength and sensation 5/5 to bilateral lower extremities. Normal great toe extension against resistance. Normal sensation throughout feet. Normal patellar reflexes. Negative SLR and opposite SLR bilaterally. Negative FABER test Skin:    General: Skin is warm and dry.     Capillary Refill: Capillary refill takes less than 2 seconds.     Findings: No rash.  Neurological:     Mental Status: The patient is awake and alert.  ED Results / Procedures / Treatments   Labs (all labs ordered are listed, but only abnormal results are displayed) Labs Reviewed  POC URINE PREG, ED     EKG     RADIOLOGY     PROCEDURES:  Critical Care performed:   Procedures   MEDICATIONS ORDERED IN ED: Medications  lidocaine (LIDODERM) 5 % 1 patch (1 patch Transdermal Not Given 10/06/22 0743)  acetaminophen (TYLENOL) tablet 650 mg (650 mg Oral Given 10/06/22 0739)  ketorolac (TORADOL) 15 MG/ML injection 15 mg (15 mg Intramuscular Given 10/06/22 0739)     IMPRESSION / MDM / ASSESSMENT AND PLAN / ED COURSE  I reviewed the triage vital signs and the nursing notes.   Differential diagnosis includes, but is not limited to, spasm, strain, radiculopathy.  Patient is awake and alert, hemodynamically stable and nontoxic in appearance.  She is ambulatory with a steady gait. with a history of back pain who presents with back pain. 5 out of 5 strength with intact sensation to extensor hallucis dorsiflexion and plantarflexion of bilateral lower extremities with normal patellar reflexes bilaterally. Most likely  etiology at this point is muscle strain vs herniated disc. No red flags to indicate patient is at risk for more auspicious process that would require urgent/emergent spinal imaging or subspecialty evaluation at this time. No major trauma, no midline tenderness, no history or physical exam findings to suggest cauda equina syndrome or spinal cord compression. No focal neurological deficits on exam. No constitutional symptoms or history of immunosuppression or IVDA to suggest potential for epidural abscess. Not anticoagulated, no history of bleeding diastasis to suggest risk for epidural hematoma. No chronic steroid use or advanced age or history of malignancy to suggest proclivity towards pathological fracture.  No abdominal pain or flank pain to suggest kidney stone, no history of kidney stone.  No fever or dysuria or CVAT to suggest pyelonephritis .  No chest pain, back pain, shortness of breath, neurological deficits, to suggest vascular catastrophe, and pulses are equal in all 4 extremities.  Patient has tenderness diffusely to her paraspinal muscles without overlying skin changes noted.  Most likely etiology is muscle spasm.  She has no radicular symptoms.  She was treated symptomatically with Toradol, Tylenol, and Lidoderm patch after a negative pregnancy test.  She requested muscle relaxers, however she drove herself to the emergency department, therefore this was provided in prescription form.  She was advised that she cannot drive, operate heavy machinery, or perform any test that require concentration while taking this medication given its sedating effects.  Discussed care instructions and return precautions with patient. Recommended close outpatient follow-up for re-evaluation. Patient agrees with plan of care. Will treat the patient symptomatically as needed for pain control. Will discharge patient to take these medications and return for any worsening or different pain or development of any neurologic  symptoms. Educated patient regarding expected time course for back pain to improve and recommended very close outpatient follow-up.  Patient understands and agrees with plan.  She was discharged in stable condition.   Patient's presentation is most consistent with acute complicated illness / injury requiring diagnostic workup.      FINAL CLINICAL IMPRESSION(S) / ED DIAGNOSES   Final diagnoses:  Muscle spasm of back     Rx / DC Orders   ED Discharge Orders          Ordered    cyclobenzaprine (FLEXERIL) 5 MG tablet  3 times daily PRN  10/06/22 0745             Note:  This document was prepared using Dragon voice recognition software and may include unintentional dictation errors.   Jackelyn Hoehn, PA-C 10/06/22 1610    Corena Herter, MD 10/06/22 [phone]

## 2022-10-06 NOTE — Discharge Instructions (Signed)
Please return to the emergency department for any new, worsening, or changing symptoms or other concerns including weakness in your legs, urinary or stool incontinence or retention, numbness or tingling in your extremities/buttocks/groin, fevers, or any other concerns or change in symptoms. Remember that the Flexeril make you very sleepy, do not take if you are driving, operating heavy machinery, or perform any tasks require concentration.  Perform gentle range of motion exercises and gentle stretching as we discussed.

## 2022-10-06 NOTE — ED Notes (Signed)
See triage note  Presents with lower back pain   States pain started about 2 weeks ago   pain is mainly to tailbone  Unknown injury  Ambulates well

## 2022-10-06 NOTE — ED Triage Notes (Signed)
Pt to ED via POV c/o back pain for 2 weeks. Pain starts in tailbone and ends at neck. Pt denies any injury. NAD at this time. Denies CP, SOB, fevers, dizziness
